# Patient Record
Sex: Female | Born: 1960 | Race: White | Hispanic: No | Marital: Married | Smoking: Former smoker
Health system: Southern US, Community
[De-identification: ages and names within clinical notes are randomized; demographics above are authoritative.]

## PROBLEM LIST (undated history)

## (undated) DIAGNOSIS — J302 Other seasonal allergic rhinitis: Secondary | ICD-10-CM

## (undated) DIAGNOSIS — J8410 Pulmonary fibrosis, unspecified: Secondary | ICD-10-CM

## (undated) DIAGNOSIS — I10 Essential (primary) hypertension: Secondary | ICD-10-CM

## (undated) HISTORY — DX: Essential (primary) hypertension: I10

## (undated) HISTORY — DX: Pulmonary fibrosis, unspecified: J84.10

---

## 1988-05-09 HISTORY — PX: LUNG BIOPSY: SHX232

## 1999-06-30 ENCOUNTER — Other Ambulatory Visit: Admission: RE | Admit: 1999-06-30 | Discharge: 1999-06-30 | Payer: Self-pay | Admitting: Obstetrics and Gynecology

## 2000-09-13 ENCOUNTER — Other Ambulatory Visit: Admission: RE | Admit: 2000-09-13 | Discharge: 2000-09-13 | Payer: Self-pay | Admitting: Obstetrics and Gynecology

## 2001-12-18 ENCOUNTER — Other Ambulatory Visit: Admission: RE | Admit: 2001-12-18 | Discharge: 2001-12-18 | Payer: Self-pay | Admitting: Obstetrics and Gynecology

## 2002-01-03 ENCOUNTER — Ambulatory Visit (HOSPITAL_COMMUNITY): Admission: RE | Admit: 2002-01-03 | Discharge: 2002-01-03 | Payer: Self-pay | Admitting: Obstetrics and Gynecology

## 2002-01-03 ENCOUNTER — Encounter: Payer: Self-pay | Admitting: Obstetrics and Gynecology

## 2003-03-14 ENCOUNTER — Ambulatory Visit (HOSPITAL_COMMUNITY): Admission: RE | Admit: 2003-03-14 | Discharge: 2003-03-14 | Payer: Self-pay | Admitting: Obstetrics and Gynecology

## 2003-03-14 ENCOUNTER — Other Ambulatory Visit: Admission: RE | Admit: 2003-03-14 | Discharge: 2003-03-14 | Payer: Self-pay | Admitting: Obstetrics and Gynecology

## 2003-04-07 ENCOUNTER — Encounter: Admission: RE | Admit: 2003-04-07 | Discharge: 2003-04-07 | Payer: Self-pay | Admitting: Obstetrics and Gynecology

## 2004-08-23 ENCOUNTER — Encounter: Admission: RE | Admit: 2004-08-23 | Discharge: 2004-08-23 | Payer: Self-pay | Admitting: Obstetrics and Gynecology

## 2004-10-06 ENCOUNTER — Other Ambulatory Visit: Admission: RE | Admit: 2004-10-06 | Discharge: 2004-10-06 | Payer: Self-pay | Admitting: Obstetrics and Gynecology

## 2005-11-30 ENCOUNTER — Encounter: Admission: RE | Admit: 2005-11-30 | Discharge: 2005-11-30 | Payer: Self-pay | Admitting: Obstetrics and Gynecology

## 2006-01-16 ENCOUNTER — Other Ambulatory Visit: Admission: RE | Admit: 2006-01-16 | Discharge: 2006-01-16 | Payer: Self-pay | Admitting: Obstetrics and Gynecology

## 2007-04-02 ENCOUNTER — Encounter: Admission: RE | Admit: 2007-04-02 | Discharge: 2007-04-02 | Payer: Self-pay | Admitting: Obstetrics and Gynecology

## 2007-05-28 ENCOUNTER — Ambulatory Visit: Payer: Self-pay | Admitting: Internal Medicine

## 2007-05-31 ENCOUNTER — Ambulatory Visit (HOSPITAL_COMMUNITY): Admission: RE | Admit: 2007-05-31 | Discharge: 2007-05-31 | Payer: Self-pay | Admitting: Internal Medicine

## 2008-04-09 ENCOUNTER — Encounter: Admission: RE | Admit: 2008-04-09 | Discharge: 2008-04-09 | Payer: Self-pay | Admitting: Obstetrics and Gynecology

## 2008-07-30 ENCOUNTER — Emergency Department (HOSPITAL_COMMUNITY): Admission: EM | Admit: 2008-07-30 | Discharge: 2008-07-30 | Payer: Self-pay | Admitting: Emergency Medicine

## 2008-12-10 ENCOUNTER — Emergency Department (HOSPITAL_COMMUNITY): Admission: EM | Admit: 2008-12-10 | Discharge: 2008-12-10 | Payer: Self-pay | Admitting: Emergency Medicine

## 2009-06-01 ENCOUNTER — Encounter: Admission: RE | Admit: 2009-06-01 | Discharge: 2009-06-01 | Payer: Self-pay | Admitting: Obstetrics and Gynecology

## 2010-05-29 ENCOUNTER — Encounter: Payer: Self-pay | Admitting: Obstetrics and Gynecology

## 2010-05-30 ENCOUNTER — Encounter: Payer: Self-pay | Admitting: Obstetrics and Gynecology

## 2010-06-07 ENCOUNTER — Encounter
Admission: RE | Admit: 2010-06-07 | Discharge: 2010-06-07 | Payer: Self-pay | Source: Home / Self Care | Attending: Obstetrics and Gynecology | Admitting: Obstetrics and Gynecology

## 2010-07-17 ENCOUNTER — Ambulatory Visit (INDEPENDENT_AMBULATORY_CARE_PROVIDER_SITE_OTHER): Payer: 59

## 2010-07-17 ENCOUNTER — Inpatient Hospital Stay (INDEPENDENT_AMBULATORY_CARE_PROVIDER_SITE_OTHER)
Admission: RE | Admit: 2010-07-17 | Discharge: 2010-07-17 | Disposition: A | Discharge status: Home / Self Care | Payer: 59 | Source: Ambulatory Visit | Attending: Family Medicine | Admitting: Family Medicine

## 2010-07-17 DIAGNOSIS — S335XXA Sprain of ligaments of lumbar spine, initial encounter: Secondary | ICD-10-CM

## 2010-08-14 LAB — POCT URINALYSIS DIP (DEVICE)
Bilirubin Urine: NEGATIVE
Glucose, UA: 100 mg/dL — AB
Ketones, ur: NEGATIVE mg/dL
Nitrite: POSITIVE — AB
Protein, ur: 30 mg/dL — AB
Specific Gravity, Urine: 1.02 (ref 1.005–1.030)
Urobilinogen, UA: 1 mg/dL (ref 0.0–1.0)
pH: 5.5 (ref 5.0–8.0)

## 2010-09-21 NOTE — Assessment & Plan Note (Signed)
[facility]                         GASTROENTEROLOGY OFFICE NOTE   NAME:Blackerby, CHARDE MACFARLANE                   MRN:          [id number]  DATE:05/28/2007                            DOB:          19-Oct-1960    Ms. Donovan is a very nice 50 year old white female who is here today  to talk about episodic abdominal pain.  So far she is at about 5 to 10  episodes over the past 2 years which start post prandially with nausea,  bloating, uncomfortable feeling in the abdomen followed by cramps and  severe diarrhea, having several bowel movements in a row.  This may  happen, usually, at the end of the day after dinner or after lunch, at  home or when she eats out.  There has been no rectal bleeding.  Her  eating habits have been regular.  She has a sedentary job for the past  10 years.  Nothing much has changed in her eating habits.  She drinks at  least 1 soda a day.  She has a lot of stress with her 40 year old son in  the last year or so.  Her weight has slowly crept up in the last 10  years by about 30 pounds.  There is no family history of colon cancer.   MEDICATIONS:  Birth control pills.   PAST HISTORY:  1. Allergies.  2. 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   OPERATIONS:  None.   FAMILY HISTORY:  Negative for colon cancer, positive for prostate cancer  in one uncle.   SOCIAL HISTORY:  Married with 2 children.  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  She does not smoke, does not drink alcohol.   REVIEW OF SYSTEMS:  Positive for night sweats, back pain, sleeping  problems, allergies, eyeglasses.   PHYSICAL EXAMINATION:  Blood pressure 120/78, pulse 80, and weight 170  pounds.  She was alert, oriented, no distress, appeared healthy.  NECK:  Supple, no lymphadenopathy.  Her thyroid was normal.  LUNGS:  Clear to auscultation.  COR:  Normal S1, normal S2.  ABDOMEN:  Soft, nontender with normoactive bowel sounds.  No distension.  Liver edge at costal margin.  Lower abdomen was normal.  RECTAL EXAM:  Normal rectal tone, stool was Hemoccult negative.   IMPRESSION:  A 50 year old white female with episodic abdominal pain and  symptoms consistent with gastrocolic reflux.  She most likely has an  irritable bowel syndrome related to stress and some dietary  indiscretions.  There is no evidence of upper gastrointestinal blood  loss.  There is no evidence of weight loss.  She has been under extreme  stress recently.  We have talked about it, and she understands what is  going on.  She is not a candidate for colonoscopy, but will be in 4  years.   PLAN:  1. High-fiber diet.  2. Benefiber 1 tbs daily.  3. Diazepam 2.5 to 5 mg p.r.n. stress.  4. Bentyl 20 mg dispense 30 one p.o. q.4-6 h. p.r.n. crampy abdominal      pain.   The Benefiber and  high-fiber diet will, hopefully, regulate her bowel  habits to where she is no longer constipated.  This may prevent the  episodes of abdominal pain and diarrhea.     Hedwig Morton. Juanda Chance, MD  Electronically Signed    DMB/MedQ  DD: 05/28/2007  DT: 05/28/2007  Job #: [id number]   cc:   Charlaine Dalton. Sherene Sires, MD, FCCP

## 2011-05-17 ENCOUNTER — Other Ambulatory Visit: Payer: Self-pay | Admitting: Obstetrics and Gynecology

## 2011-05-17 DIAGNOSIS — Z1231 Encounter for screening mammogram for malignant neoplasm of breast: Secondary | ICD-10-CM

## 2011-06-20 ENCOUNTER — Ambulatory Visit
Admission: RE | Admit: 2011-06-20 | Discharge: 2011-06-20 | Disposition: A | Discharge status: Home / Self Care | Payer: Private Health Insurance - Indemnity | Source: Ambulatory Visit | Attending: Obstetrics and Gynecology | Admitting: Obstetrics and Gynecology

## 2011-06-20 DIAGNOSIS — Z1231 Encounter for screening mammogram for malignant neoplasm of breast: Secondary | ICD-10-CM

## 2011-06-22 ENCOUNTER — Other Ambulatory Visit: Payer: Self-pay | Admitting: Obstetrics and Gynecology

## 2011-06-22 DIAGNOSIS — R928 Other abnormal and inconclusive findings on diagnostic imaging of breast: Secondary | ICD-10-CM

## 2011-07-05 ENCOUNTER — Other Ambulatory Visit: Payer: Self-pay | Admitting: Obstetrics and Gynecology

## 2011-07-05 ENCOUNTER — Ambulatory Visit
Admission: RE | Admit: 2011-07-05 | Discharge: 2011-07-05 | Disposition: A | Discharge status: Home / Self Care | Payer: Private Health Insurance - Indemnity | Source: Ambulatory Visit | Attending: Obstetrics and Gynecology | Admitting: Obstetrics and Gynecology

## 2011-07-05 DIAGNOSIS — R928 Other abnormal and inconclusive findings on diagnostic imaging of breast: Secondary | ICD-10-CM

## 2011-07-09 ENCOUNTER — Emergency Department (HOSPITAL_COMMUNITY)
Admission: EM | Admit: 2011-07-09 | Discharge: 2011-07-09 | Disposition: A | Discharge status: Home / Self Care | Payer: Private Health Insurance - Indemnity | Source: Home / Self Care | Attending: Family Medicine | Admitting: Family Medicine

## 2011-07-09 ENCOUNTER — Encounter (HOSPITAL_COMMUNITY): Payer: Self-pay

## 2011-07-09 DIAGNOSIS — N390 Urinary tract infection, site not specified: Secondary | ICD-10-CM

## 2011-07-09 LAB — POCT URINALYSIS DIP (DEVICE)
Bilirubin Urine: NEGATIVE
Glucose, UA: NEGATIVE mg/dL
Ketones, ur: NEGATIVE mg/dL
Nitrite: POSITIVE — AB
Protein, ur: 100 mg/dL — AB
Specific Gravity, Urine: 1.025 (ref 1.005–1.030)
Urobilinogen, UA: 0.2 mg/dL (ref 0.0–1.0)
pH: 5.5 (ref 5.0–8.0)

## 2011-07-09 MED ORDER — PHENAZOPYRIDINE HCL 200 MG PO TABS: 200.0000 mg | ORAL_TABLET | Freq: Three times a day (TID) | ORAL | Status: AC | PRN

## 2011-07-09 MED ORDER — CIPROFLOXACIN HCL 500 MG PO TABS: 500.0000 mg | ORAL_TABLET | Freq: Two times a day (BID) | ORAL | Status: AC

## 2011-07-09 NOTE — ED Notes (Signed)
Pt has urinary frequency, burning and is seeing blood when wiping.

## 2011-07-09 NOTE — Discharge Instructions (Signed)

## 2011-07-09 NOTE — ED Provider Notes (Signed)
History     CSN: [id number]  Arrival date & time 07/09/11  1548   First MD Initiated Contact with Patient 07/09/11 1608      Chief Complaint  Patient presents with  . Urinary Tract Infection    (Consider location/radiation/quality/duration/timing/severity/associated sxs/prior treatment) Patient is a 52 y.o. female presenting with urinary tract infection. The history is provided by the patient.  Urinary Tract Infection This is a new problem. The current episode started more than 2 days ago (started on Monday). The problem occurs constantly. The problem has been gradually worsening. Pertinent negatives include no chest pain, no abdominal pain, no headaches and no shortness of breath. The symptoms are aggravated by nothing. The symptoms are relieved by nothing. She has tried nothing for the symptoms.    History reviewed. No pertinent past medical history.  History reviewed. No pertinent past surgical history.  History reviewed. No pertinent family history.  History  Substance Use Topics  . Smoking status: Never Smoker   . Smokeless tobacco: Not on file  . Alcohol Use: No    OB History    Grav Para Term Preterm Abortions TAB SAB Ect Mult Living                  Review of Systems  Respiratory: Negative for shortness of breath.   Cardiovascular: Negative for chest pain.  Gastrointestinal: Negative for abdominal pain.  Neurological: Negative for headaches.  All other systems reviewed and are negative.    Allergies  Macrobid  Home Medications  No current outpatient prescriptions on file.  BP 127/81  Pulse 89  Temp(Src) 99.1 F (37.3 C) (Oral)  Resp 14  SpO2 99%  LMP 05/17/2011  Physical Exam  Constitutional: She is oriented to person, place, and time. She appears well-developed and well-nourished.  HENT:  Head: Normocephalic.  Abdominal: Soft. There is no tenderness. There is no rebound and no CVA tenderness.  Neurological: She is alert and oriented to  person, place, and time.  Skin: Skin is warm and dry.  Psychiatric: She has a normal mood and affect. Her behavior is normal.    ED Course  Procedures (including critical care time)  Labs Reviewed  POCT URINALYSIS DIP (DEVICE) - Abnormal; Notable for the following:    Hgb urine dipstick LARGE (*)    Protein, ur 100 (*)    Nitrite POSITIVE (*)    Leukocytes, UA MODERATE (*) Biochemical Testing Only. Please order routine urinalysis from main lab if confirmatory testing is needed.   All other components within normal limits   UTI cipro and pyridium   MDM         Hassan Rowan, MD 07/09/11 [phone]

## 2011-07-11 LAB — URINE CULTURE
Colony Count: >=100000
Culture  Setup Time: 201303022005

## 2011-07-12 NOTE — ED Notes (Signed)
Urine culture: >100,000 colonies E. Coli. Pt. adequately treated with Cipro.  Carmen Bowen 07/12/2011  

## 2012-02-01 ENCOUNTER — Ambulatory Visit (INDEPENDENT_AMBULATORY_CARE_PROVIDER_SITE_OTHER): Payer: Private Health Insurance - Indemnity | Admitting: Physician Assistant

## 2012-02-01 VITALS — BP 124/70 | HR 76 | Temp 98.6°F | Resp 16 | Ht 69.0 in | Wt 171.0 lb

## 2012-02-01 DIAGNOSIS — J3489 Other specified disorders of nose and nasal sinuses: Secondary | ICD-10-CM

## 2012-02-01 DIAGNOSIS — R0982 Postnasal drip: Secondary | ICD-10-CM

## 2012-02-01 DIAGNOSIS — H9209 Otalgia, unspecified ear: Secondary | ICD-10-CM

## 2012-02-01 DIAGNOSIS — R0981 Nasal congestion: Secondary | ICD-10-CM

## 2012-02-01 MED ORDER — IPRATROPIUM BROMIDE 0.03 % NA SOLN: 2.0000 | Freq: Two times a day (BID) | NASAL | Status: DC

## 2012-02-01 NOTE — Patient Instructions (Addendum)
Use Atrovent nasal spray twice daily.  Continue zyrtec every night.  Continue nasal saline.  Call or come in if worsening in the next two days, or if not improving in one week.

## 2012-02-01 NOTE — Progress Notes (Signed)
  Subjective:    Patient ID: Carmen Bowen, female    DOB: 09-21-1960, 51 y.o.   MRN: [id number]  HPI  51 yr old female complains of a two week history of ear itching, fullness, and pain L>R.  She is also experiencing post-nasal drip as well as nasal congestion.  She denies HA, sinus pain or pressure, or fever.  She currently takes zyrtec nightly for allergic rhinitis and uses nasal saline regularly.     Review of Systems  Constitutional: Negative for fever, chills and appetite change.  HENT: Positive for ear pain, congestion, rhinorrhea and postnasal drip. Negative for hearing loss, facial swelling, sneezing, neck stiffness, tinnitus and ear discharge.   Eyes: Negative.   Respiratory: Negative.   Cardiovascular: Negative.   Gastrointestinal: Negative.   Neurological: Negative for headaches.       Objective:   Physical Exam  Constitutional: She appears well-developed and well-nourished. No distress.  HENT:  Right Ear: Hearing, external ear and ear canal normal. Tympanic membrane is not erythematous, not retracted and not bulging. A middle ear effusion is present.  Left Ear: Hearing, external ear and ear canal normal. Tympanic membrane is not erythematous, not retracted and not bulging. A middle ear effusion is present.  Nose: Mucosal edema and rhinorrhea present.  Mouth/Throat: Oropharynx is clear and moist and mucous membranes are normal.  Cardiovascular: Normal rate, regular rhythm and normal heart sounds.   Pulmonary/Chest: Breath sounds normal. She has no wheezes. She has no rales.  Lymphadenopathy:    She has no cervical adenopathy.          Assessment & Plan:   1. Nasal congestion  ipratropium (ATROVENT) 0.03 % nasal spray  2. Ear pain    3. Post-nasal drip     Given the patient's allergic symptoms, and lack of erythema/bulging of the TMs, I do not think an antibiotic is appropriate at this time.  Discussed with patient, who is in agreement.  Sent prescription for  Atrovent nasal spray to relieve nasal drainage symptoms.  Offered a short steroid taper to reduce Eustachian tube dysfunction, but patient was not interested in steroids at this time.  Encouraged continuation of daily zyrtec as well as nasal saline.  Instructed patient to call or come in if feeling worse over the next two days or not improving in one week.    I have discussed, reviewed chart, and agree with plan.  Rhoderick Moody, PA-C

## 2012-05-07 ENCOUNTER — Encounter (HOSPITAL_COMMUNITY): Payer: Self-pay | Admitting: Pharmacist

## 2012-05-14 ENCOUNTER — Encounter (HOSPITAL_COMMUNITY)
Admission: RE | Admit: 2012-05-14 | Discharge: 2012-05-14 | Disposition: A | Discharge status: Home / Self Care | Payer: Managed Care, Other (non HMO) | Source: Ambulatory Visit | Attending: Obstetrics and Gynecology | Admitting: Obstetrics and Gynecology

## 2012-05-14 ENCOUNTER — Encounter (HOSPITAL_COMMUNITY): Payer: Self-pay

## 2012-05-14 HISTORY — DX: Other seasonal allergic rhinitis: J30.2

## 2012-05-14 LAB — CBC
MCH: 31 pg (ref 26.0–34.0)
MCHC: 33.7 g/dL (ref 30.0–36.0)
RDW: 12.9 % (ref 11.5–15.5)

## 2012-05-14 NOTE — Patient Instructions (Addendum)
   Your procedure is scheduled AV:[id number] January 7th  Enter through the Main Entrance of [facility] at:6am Pick up the phone at the desk and dial [phone] and inform us of your arrival.  Please call this number if you have any problems the morning of surgery: [phone]  Remember: Do not eat or drink anything after midnight on Monday   Do not wear jewelry, make-up, or FINGER nail polish No metal in your hair or on your body. Do not wear lotions, powders, perfumes. You may wear deodorant.  Please use your CHG wash as directed prior to surgery.  Do not shave anywhere for at least 12 hours prior to first CHG shower.  Do not bring valuables to the hospital.   Leave suitcase in the car. After Surgery it may be brought to your room. For patients being admitted to the hospital, checkout time is 11:00am the day of discharge.

## 2012-05-14 NOTE — H&P (Signed)
Carmen Bowen is an 52 y.o. female G2P2 who presents for a scheduled LAVH for pelvic prolapse.  She first noted symptoms in the last year that have become more troublesome in the last 6 months.  She denies urinary incontinence or symptoms.  SHe does not want her ovaries removed, but is agreeable to removing her tubes to possibly decrease her risk of future ovarian cancer.   She is postmenopausal.  Pertinent Gynecological History: prolapse Menstrual History:  Patient's last menstrual period was 05/17/2011.    Past Medical History  Diagnosis Date  . Pulmonary fibrosis dxx 20 years ago    Sees Dr. Sandrea Hughs (pulm)  . Seasonal allergies     Past Surgical History  Procedure Date  . Lung biopsy 1990    No family history on file.  Social History:  reports that she quit smoking about 34 years ago. She does not have any smokeless tobacco history on file. She reports that she does not drink alcohol or use illicit drugs.  Allergies:  Allergies  Allergen Reactions  . Nitrofurantoin Monohyd Macro Palpitations and Other (See Comments)    Flu-like symptoms    No prescriptions prior to admission    ROS  Last menstrual period 05/17/2011. Physical Exam  Constitutional: She is oriented to person, place, and time. She appears well-developed and well-nourished.  Cardiovascular: Normal rate and regular rhythm.   Respiratory: Effort normal and breath sounds normal.  GI: Soft. Bowel sounds are normal.  Genitourinary: Vagina normal.       Uterine prolapse with cervix approaching introitus Normal size uterine fundus No adnexal masses Minimal cystocele Moderate rectocele  Musculoskeletal: Normal range of motion.  Neurological: She is alert and oriented to person, place, and time.  Psychiatric: She has a normal mood and affect. Her behavior is normal.    Results for orders placed during the hospital encounter of 05/14/12 (from the past 24 hour(s))  CBC     Status: Normal   Collection Time   05/14/12  9:15 AM      Component Value Range   WBC 6.5  4.0 - 10.5 K/uL   RBC 4.52  3.87 - 5.11 MIL/uL   Hemoglobin 14.0  12.0 - 15.0 g/dL   HCT 96.0  45.4 - 09.8 %   MCV 92.0  78.0 - 100.0 fL   MCH 31.0  26.0 - 34.0 pg   MCHC 33.7  30.0 - 36.0 g/dL   RDW 11.9  14.7 - 82.9 %   Platelets 242  150 - 400 K/uL    No results found.  Assessment/Plan: The patient was counseled on risks and benefits of surgery including bleeding, infection and possible damage to bowel and bladder.  We discussed the need for an open incision should a complication arise and possible delayed recovery.  We also discussed at length whether to remove or keep her ovaries.  She would like to retain her ovaries, but is interested in a distal salpingectomy to possibly reduce her future ovarian cancer risk.  She is ready to proceed.  Oliver Pila 05/14/2012, 5:07 PM

## 2012-05-15 ENCOUNTER — Encounter (HOSPITAL_COMMUNITY): Payer: Self-pay | Admitting: *Deleted

## 2012-05-15 ENCOUNTER — Observation Stay (HOSPITAL_COMMUNITY)
Admission: RE | Admit: 2012-05-15 | Discharge: 2012-05-16 | Disposition: A | Discharge status: Home / Self Care | Payer: Managed Care, Other (non HMO) | Source: Ambulatory Visit | Attending: Obstetrics and Gynecology | Admitting: Obstetrics and Gynecology

## 2012-05-15 ENCOUNTER — Ambulatory Visit (HOSPITAL_COMMUNITY): Payer: Managed Care, Other (non HMO) | Admitting: Anesthesiology

## 2012-05-15 ENCOUNTER — Encounter (HOSPITAL_COMMUNITY): Payer: Self-pay | Admitting: Anesthesiology

## 2012-05-15 ENCOUNTER — Encounter (HOSPITAL_COMMUNITY)
Admission: RE | Disposition: A | Discharge status: Home / Self Care | Payer: Self-pay | Source: Ambulatory Visit | Attending: Obstetrics and Gynecology

## 2012-05-15 ENCOUNTER — Encounter (HOSPITAL_COMMUNITY): Payer: Self-pay

## 2012-05-15 DIAGNOSIS — Z90710 Acquired absence of both cervix and uterus: Secondary | ICD-10-CM

## 2012-05-15 DIAGNOSIS — N8000 Endometriosis of the uterus, unspecified: Secondary | ICD-10-CM | POA: Insufficient documentation

## 2012-05-15 DIAGNOSIS — Z01812 Encounter for preprocedural laboratory examination: Secondary | ICD-10-CM | POA: Insufficient documentation

## 2012-05-15 DIAGNOSIS — Z01818 Encounter for other preprocedural examination: Secondary | ICD-10-CM | POA: Insufficient documentation

## 2012-05-15 DIAGNOSIS — N812 Incomplete uterovaginal prolapse: Principal | ICD-10-CM | POA: Insufficient documentation

## 2012-05-15 DIAGNOSIS — R0981 Nasal congestion: Secondary | ICD-10-CM

## 2012-05-15 DIAGNOSIS — D250 Submucous leiomyoma of uterus: Secondary | ICD-10-CM | POA: Insufficient documentation

## 2012-05-15 DIAGNOSIS — D251 Intramural leiomyoma of uterus: Secondary | ICD-10-CM | POA: Insufficient documentation

## 2012-05-15 DIAGNOSIS — N838 Other noninflammatory disorders of ovary, fallopian tube and broad ligament: Secondary | ICD-10-CM | POA: Insufficient documentation

## 2012-05-15 HISTORY — PX: ANTERIOR AND POSTERIOR REPAIR: SHX5121

## 2012-05-15 HISTORY — PX: BILATERAL SALPINGECTOMY: SHX5743

## 2012-05-15 HISTORY — PX: LAPAROSCOPIC ASSISTED VAGINAL HYSTERECTOMY: SHX5398

## 2012-05-15 LAB — TYPE AND SCREEN: Antibody Screen: NEGATIVE

## 2012-05-15 LAB — SURGICAL PCR SCREEN: MRSA, PCR: NEGATIVE

## 2012-05-15 SURGERY — HYSTERECTOMY, VAGINAL, LAPAROSCOPY-ASSISTED
Anesthesia: General | Site: Vagina | Wound class: Clean Contaminated

## 2012-05-15 MED ORDER — NEOSTIGMINE METHYLSULFATE 1 MG/ML IJ SOLN
INTRAMUSCULAR | Status: DC | PRN
  Administered 2012-05-15: 4 mg via INTRAVENOUS

## 2012-05-15 MED ORDER — DIPHENHYDRAMINE HCL 50 MG/ML IJ SOLN: 12.5000 mg | Freq: Four times a day (QID) | INTRAMUSCULAR | Status: DC | PRN

## 2012-05-15 MED ORDER — EPHEDRINE SULFATE 50 MG/ML IJ SOLN
INTRAMUSCULAR | Status: DC | PRN
  Administered 2012-05-15: 5 mg via INTRAVENOUS

## 2012-05-15 MED ORDER — MUPIROCIN 2 % EX OINT
TOPICAL_OINTMENT | Freq: Two times a day (BID) | CUTANEOUS | Status: DC
  Administered 2012-05-15: 1 via NASAL

## 2012-05-15 MED ORDER — EPHEDRINE 5 MG/ML INJ
INTRAVENOUS | Status: AC
  Filled 2012-05-15: qty 10

## 2012-05-15 MED ORDER — KETOROLAC TROMETHAMINE 30 MG/ML IJ SOLN
INTRAMUSCULAR | Status: AC
  Administered 2012-05-15: 30 mg via INTRAVENOUS
  Filled 2012-05-15: qty 1

## 2012-05-15 MED ORDER — PROMETHAZINE HCL 25 MG/ML IJ SOLN: 6.2500 mg | INTRAMUSCULAR | Status: DC | PRN

## 2012-05-15 MED ORDER — LORATADINE 10 MG PO TABS
10.0000 mg | ORAL_TABLET | Freq: Every day | ORAL | Status: DC
  Filled 2012-05-15 (×3): qty 1

## 2012-05-15 MED ORDER — CEFAZOLIN SODIUM-DEXTROSE 2-3 GM-% IV SOLR: 2.0000 g | INTRAVENOUS | Status: DC

## 2012-05-15 MED ORDER — SENNOSIDES-DOCUSATE SODIUM 8.6-50 MG PO TABS: 1.0000 | ORAL_TABLET | Freq: Every evening | ORAL | Status: DC | PRN

## 2012-05-15 MED ORDER — ESTRADIOL 0.1 MG/GM VA CREA
TOPICAL_CREAM | VAGINAL | Status: DC | PRN
  Administered 2012-05-15: 1 via VAGINAL

## 2012-05-15 MED ORDER — LACTATED RINGERS IV SOLN
INTRAVENOUS | Status: DC
  Administered 2012-05-15: 11:00:00 via INTRAVENOUS

## 2012-05-15 MED ORDER — DIPHENHYDRAMINE HCL 12.5 MG/5ML PO ELIX: 12.5000 mg | ORAL_SOLUTION | Freq: Four times a day (QID) | ORAL | Status: DC | PRN

## 2012-05-15 MED ORDER — LACTATED RINGERS IV SOLN
INTRAVENOUS | Status: DC
  Administered 2012-05-15 – 2012-05-16 (×2): via INTRAVENOUS

## 2012-05-15 MED ORDER — MENTHOL 3 MG MT LOZG: 1.0000 | LOZENGE | OROMUCOSAL | Status: DC | PRN

## 2012-05-15 MED ORDER — VASOPRESSIN 20 UNIT/ML IJ SOLN
INTRAMUSCULAR | Status: DC | PRN
  Administered 2012-05-15: 08:00:00 via INTRAMUSCULAR

## 2012-05-15 MED ORDER — PROPOFOL 10 MG/ML IV EMUL
INTRAVENOUS | Status: DC | PRN
  Administered 2012-05-15: 200 mg via INTRAVENOUS

## 2012-05-15 MED ORDER — DEXAMETHASONE SODIUM PHOSPHATE 10 MG/ML IJ SOLN
INTRAMUSCULAR | Status: AC
  Filled 2012-05-15: qty 1

## 2012-05-15 MED ORDER — ONDANSETRON HCL 4 MG/2ML IJ SOLN
INTRAMUSCULAR | Status: DC | PRN
  Administered 2012-05-15: 4 mg via INTRAVENOUS

## 2012-05-15 MED ORDER — ESTRADIOL 0.1 MG/GM VA CREA
TOPICAL_CREAM | VAGINAL | Status: AC
  Filled 2012-05-15: qty 42.5

## 2012-05-15 MED ORDER — CEFAZOLIN SODIUM-DEXTROSE 2-3 GM-% IV SOLR
INTRAVENOUS | Status: AC
  Administered 2012-05-15: 2 g via INTRAVENOUS
  Filled 2012-05-15: qty 50

## 2012-05-15 MED ORDER — HYDROMORPHONE HCL PF 1 MG/ML IJ SOLN
0.2500 mg | INTRAMUSCULAR | Status: DC | PRN
  Administered 2012-05-15 (×2): 0.5 mg via INTRAVENOUS

## 2012-05-15 MED ORDER — GLYCOPYRROLATE 0.2 MG/ML IJ SOLN
INTRAMUSCULAR | Status: AC
  Filled 2012-05-15: qty 1

## 2012-05-15 MED ORDER — MEPERIDINE HCL 25 MG/ML IJ SOLN: 6.2500 mg | INTRAMUSCULAR | Status: DC | PRN

## 2012-05-15 MED ORDER — PROPOFOL 10 MG/ML IV EMUL
INTRAVENOUS | Status: AC
  Filled 2012-05-15: qty 20

## 2012-05-15 MED ORDER — ONDANSETRON HCL 4 MG/2ML IJ SOLN: 4.0000 mg | Freq: Four times a day (QID) | INTRAMUSCULAR | Status: DC | PRN

## 2012-05-15 MED ORDER — FENTANYL CITRATE 0.05 MG/ML IJ SOLN
INTRAMUSCULAR | Status: DC | PRN
  Administered 2012-05-15: 100 ug via INTRAVENOUS
  Administered 2012-05-15 (×2): 50 ug via INTRAVENOUS

## 2012-05-15 MED ORDER — SODIUM CHLORIDE 0.9 % IJ SOLN: 9.0000 mL | INTRAMUSCULAR | Status: DC | PRN

## 2012-05-15 MED ORDER — LIDOCAINE HCL (CARDIAC) 20 MG/ML IV SOLN
INTRAVENOUS | Status: AC
  Filled 2012-05-15: qty 5

## 2012-05-15 MED ORDER — BUPIVACAINE HCL (PF) 0.25 % IJ SOLN
INTRAMUSCULAR | Status: AC
  Filled 2012-05-15: qty 30

## 2012-05-15 MED ORDER — SODIUM CHLORIDE 0.9 % IJ SOLN
INTRAMUSCULAR | Status: DC | PRN
  Administered 2012-05-15: 7 mL

## 2012-05-15 MED ORDER — FENTANYL CITRATE 0.05 MG/ML IJ SOLN
INTRAMUSCULAR | Status: AC
  Filled 2012-05-15: qty 5

## 2012-05-15 MED ORDER — LACTATED RINGERS IV SOLN
INTRAVENOUS | Status: DC | PRN
  Administered 2012-05-15 (×3): via INTRAVENOUS

## 2012-05-15 MED ORDER — VASOPRESSIN 20 UNIT/ML IJ SOLN
INTRAMUSCULAR | Status: AC
  Filled 2012-05-15: qty 1

## 2012-05-15 MED ORDER — DOCUSATE SODIUM 100 MG PO CAPS
100.0000 mg | ORAL_CAPSULE | Freq: Two times a day (BID) | ORAL | Status: DC
  Administered 2012-05-15 – 2012-05-16 (×2): 100 mg via ORAL
  Filled 2012-05-15 (×2): qty 1

## 2012-05-15 MED ORDER — MUPIROCIN 2 % EX OINT
TOPICAL_OINTMENT | CUTANEOUS | Status: AC
  Administered 2012-05-15: 1 via NASAL
  Filled 2012-05-15: qty 22

## 2012-05-15 MED ORDER — GLYCOPYRROLATE 0.2 MG/ML IJ SOLN
INTRAMUSCULAR | Status: DC | PRN
  Administered 2012-05-15: 0.6 mg via INTRAVENOUS

## 2012-05-15 MED ORDER — LACTATED RINGERS IV SOLN
INTRAVENOUS | Status: DC
  Administered 2012-05-15: 07:00:00 via INTRAVENOUS

## 2012-05-15 MED ORDER — NALOXONE HCL 0.4 MG/ML IJ SOLN: 0.4000 mg | INTRAMUSCULAR | Status: DC | PRN

## 2012-05-15 MED ORDER — NEOSTIGMINE METHYLSULFATE 1 MG/ML IJ SOLN
INTRAMUSCULAR | Status: AC
  Filled 2012-05-15: qty 1

## 2012-05-15 MED ORDER — MIDAZOLAM HCL 2 MG/2ML IJ SOLN
INTRAMUSCULAR | Status: AC
  Filled 2012-05-15: qty 4

## 2012-05-15 MED ORDER — ROCURONIUM BROMIDE 50 MG/5ML IV SOLN
INTRAVENOUS | Status: AC
  Filled 2012-05-15: qty 1

## 2012-05-15 MED ORDER — LIDOCAINE HCL (CARDIAC) 20 MG/ML IV SOLN
INTRAVENOUS | Status: DC | PRN
  Administered 2012-05-15: 80 mg via INTRAVENOUS

## 2012-05-15 MED ORDER — DEXAMETHASONE SODIUM PHOSPHATE 4 MG/ML IJ SOLN
INTRAMUSCULAR | Status: DC | PRN
  Administered 2012-05-15: 10 mg via INTRAVENOUS

## 2012-05-15 MED ORDER — SIMETHICONE 80 MG PO CHEW: 80.0000 mg | CHEWABLE_TABLET | Freq: Four times a day (QID) | ORAL | Status: DC | PRN

## 2012-05-15 MED ORDER — IPRATROPIUM BROMIDE 0.03 % NA SOLN: 2.0000 | Freq: Two times a day (BID) | NASAL | Status: DC

## 2012-05-15 MED ORDER — BUPIVACAINE HCL (PF) 0.25 % IJ SOLN
INTRAMUSCULAR | Status: DC | PRN
  Administered 2012-05-15: 12 mL

## 2012-05-15 MED ORDER — HYDROMORPHONE 0.3 MG/ML IV SOLN
INTRAVENOUS | Status: DC
  Administered 2012-05-15: 0.999 mg via INTRAVENOUS
  Administered 2012-05-15: 11:00:00 via INTRAVENOUS
  Administered 2012-05-15: 2.69 mg via INTRAVENOUS
  Administered 2012-05-15: 7.9 mg via INTRAVENOUS
  Administered 2012-05-16: 0.799 mg via INTRAVENOUS
  Administered 2012-05-16: 0.399 mg via INTRAVENOUS
  Filled 2012-05-15: qty 25

## 2012-05-15 MED ORDER — ROCURONIUM BROMIDE 100 MG/10ML IV SOLN
INTRAVENOUS | Status: DC | PRN
  Administered 2012-05-15: 50 mg via INTRAVENOUS
  Administered 2012-05-15: 10 mg via INTRAVENOUS

## 2012-05-15 MED ORDER — HYDROMORPHONE HCL PF 1 MG/ML IJ SOLN
INTRAMUSCULAR | Status: AC
  Administered 2012-05-15: 0.5 mg via INTRAVENOUS
  Filled 2012-05-15: qty 1

## 2012-05-15 MED ORDER — KETOROLAC TROMETHAMINE 30 MG/ML IJ SOLN
15.0000 mg | Freq: Once | INTRAMUSCULAR | Status: AC | PRN
  Administered 2012-05-15: 30 mg via INTRAVENOUS

## 2012-05-15 MED ORDER — OXYCODONE-ACETAMINOPHEN 5-325 MG PO TABS: 1.0000 | ORAL_TABLET | ORAL | Status: DC | PRN

## 2012-05-15 MED ORDER — MIDAZOLAM HCL 5 MG/5ML IJ SOLN
INTRAMUSCULAR | Status: DC | PRN
  Administered 2012-05-15: 2 mg via INTRAVENOUS

## 2012-05-15 MED ORDER — IBUPROFEN 600 MG PO TABS
600.0000 mg | ORAL_TABLET | Freq: Four times a day (QID) | ORAL | Status: DC | PRN
  Administered 2012-05-16: 600 mg via ORAL
  Filled 2012-05-15: qty 1

## 2012-05-15 MED ORDER — ONDANSETRON HCL 4 MG PO TABS: 4.0000 mg | ORAL_TABLET | Freq: Four times a day (QID) | ORAL | Status: DC | PRN

## 2012-05-15 MED ORDER — ONDANSETRON HCL 4 MG/2ML IJ SOLN
INTRAMUSCULAR | Status: AC
  Filled 2012-05-15: qty 2

## 2012-05-15 SURGICAL SUPPLY — 63 items
ADH SKN CLS APL DERMABOND .7 (GAUZE/BANDAGES/DRESSINGS) ×4
BLADE SURG 15 STRL LF C SS BP (BLADE) IMPLANT
BLADE SURG 15 STRL SS (BLADE)
CABLE HIGH FREQUENCY MONO STRZ (ELECTRODE) IMPLANT
CATH ROBINSON RED A/P 16FR (CATHETERS) IMPLANT
CHLORAPREP W/TINT 26ML (MISCELLANEOUS) ×5 IMPLANT
CLOTH BEACON ORANGE TIMEOUT ST (SAFETY) ×5 IMPLANT
CONT PATH 16OZ SNAP LID 3702 (MISCELLANEOUS) ×5 IMPLANT
COVER TABLE BACK 60X90 (DRAPES) ×5 IMPLANT
DECANTER SPIKE VIAL GLASS SM (MISCELLANEOUS) ×10 IMPLANT
DERMABOND ADVANCED (GAUZE/BANDAGES/DRESSINGS) ×1
DERMABOND ADVANCED .7 DNX12 (GAUZE/BANDAGES/DRESSINGS) ×1 IMPLANT
ELECT REM PT RETURN 9FT ADLT (ELECTROSURGICAL) ×5
ELECTRODE REM PT RTRN 9FT ADLT (ELECTROSURGICAL) ×1 IMPLANT
GAUZE PACKING 2X5 YD STERILE (GAUZE/BANDAGES/DRESSINGS) ×5 IMPLANT
GLOVE BIO SURGEON STRL SZ 6.5 (GLOVE) ×2 IMPLANT
GLOVE BIO SURGEON STRL SZ8 (GLOVE) ×7 IMPLANT
GLOVE BIO SURGEON STRL SZ8.5 (GLOVE) ×2 IMPLANT
GLOVE BIOGEL PI IND STRL 6.5 (GLOVE) ×5 IMPLANT
GLOVE BIOGEL PI IND STRL 7.0 (GLOVE) ×4 IMPLANT
GLOVE BIOGEL PI IND STRL 8.5 (GLOVE) ×1 IMPLANT
GLOVE BIOGEL PI INDICATOR 6.5 (GLOVE) ×2
GLOVE BIOGEL PI INDICATOR 7.0 (GLOVE) ×4
GLOVE BIOGEL PI INDICATOR 8.5 (GLOVE) ×1
GLOVE ORTHO TXT STRL SZ7.5 (GLOVE) ×6 IMPLANT
GLOVE SURG SS PI 7.0 STRL IVOR (GLOVE) ×2 IMPLANT
GOWN PREVENTION PLUS XLARGE (GOWN DISPOSABLE) ×3 IMPLANT
GOWN STRL REIN XL XLG (GOWN DISPOSABLE) ×24 IMPLANT
MARKER SKIN DUAL TIP RULER LAB (MISCELLANEOUS) ×2 IMPLANT
NDL INSUFFLATION 14GA 120MM (NEEDLE) ×3 IMPLANT
NEEDLE HYPO 22GX1.5 SAFETY (NEEDLE) ×2 IMPLANT
NEEDLE INSUFFLATION 14GA 120MM (NEEDLE) ×5 IMPLANT
NEEDLE MAYO .5 CIRCLE (NEEDLE) IMPLANT
NS IRRIG 1000ML POUR BTL (IV SOLUTION) ×7 IMPLANT
PACK LAVH (CUSTOM PROCEDURE TRAY) ×5 IMPLANT
PACK VAGINAL WOMENS (CUSTOM PROCEDURE TRAY) ×7 IMPLANT
PAD OB MATERNITY 4.3X12.25 (PERSONAL CARE ITEMS) ×2 IMPLANT
PROTECTOR NERVE ULNAR (MISCELLANEOUS) ×7 IMPLANT
SCALPEL HARMONIC ACE (MISCELLANEOUS) ×5 IMPLANT
SET IRRIG TUBING LAPAROSCOPIC (IRRIGATION / IRRIGATOR) ×5 IMPLANT
SLEEVE Z-THREAD 5X100MM (TROCAR) ×10 IMPLANT
STRIP CLOSURE SKIN 1/4X3 (GAUZE/BANDAGES/DRESSINGS) IMPLANT
SUT PROLENE 1 CT 1 30 (SUTURE) IMPLANT
SUT SILK 0 FSL (SUTURE) ×5 IMPLANT
SUT VIC AB 0 CT1 18XCR BRD8 (SUTURE) ×11 IMPLANT
SUT VIC AB 0 CT1 27 (SUTURE) ×5
SUT VIC AB 0 CT1 27XBRD ANBCTR (SUTURE) ×1 IMPLANT
SUT VIC AB 0 CT1 8-18 (SUTURE) ×10
SUT VIC AB 2-0 CT1 (SUTURE) ×5 IMPLANT
SUT VIC AB 2-0 CT1 27 (SUTURE) ×10
SUT VIC AB 2-0 CT1 TAPERPNT 27 (SUTURE) ×2 IMPLANT
SUT VIC AB 2-0 UR5 27 (SUTURE) IMPLANT
SUT VICRYL 0 TIES 12 18 (SUTURE) ×2 IMPLANT
SUT VICRYL 0 UR6 27IN ABS (SUTURE) ×2 IMPLANT
SUT VICRYL 4-0 PS2 18IN ABS (SUTURE) ×5 IMPLANT
SYR TB 1ML 25GX5/8 (SYRINGE) ×2 IMPLANT
TOWEL OR 17X24 6PK STRL BLUE (TOWEL DISPOSABLE) ×12 IMPLANT
TRAY FOLEY CATH 14FR (SET/KITS/TRAYS/PACK) ×5 IMPLANT
TROCAR Z-THREAD BLADED 11X100M (TROCAR) ×2 IMPLANT
TROCAR Z-THREAD FIOS 5X100MM (TROCAR) ×5 IMPLANT
TROCAR Z-THREAD OPTICAL 11X100 (TROCAR) ×2 IMPLANT
WARMER LAPAROSCOPE (MISCELLANEOUS) ×5 IMPLANT
WATER STERILE IRR 1000ML POUR (IV SOLUTION) ×5 IMPLANT

## 2012-05-15 NOTE — Brief Op Note (Signed)
05/15/2012  9:21 AM  PATIENT:  Carmen Bowen  52 y.o. female  PRE-OPERATIVE DIAGNOSIS:  uterine prolapse  POST-OPERATIVE DIAGNOSIS:  uterine prolapse  PROCEDURE:  LAVH/Salpingectomy and Posterior repair  SURGEON:  Surgeon(s) and Role:    * Oliver Pila, MD - Primary    * Lavina Hamman, MD - Assisting  ANESTHESIA:   general  EBL:  Total I/O In: 1600 [I.V.:1600] Out: 550 [Urine:350; Blood:200]  BLOOD ADMINISTERED:none  DRAINS: Urinary Catheter (Foley)   LOCAL MEDICATIONS USED:  MARCAINE     SPECIMEN:  Uterus, tubes,  paratubal cyst and cervix  DISPOSITION OF SPECIMEN:  PATHOLOGY  COUNTS:  YES  TOURNIQUET:  * No tourniquets in log *  DICTATION: .Dragon Dictation  PLAN OF CARE: Admit for overnight observation  PATIENT DISPOSITION:  PACU - hemodynamically stable.

## 2012-05-15 NOTE — Progress Notes (Signed)
Patient ID: Carmen Bowen, female   DOB: Feb 24, 1961, 52 y.o.   MRN: [id number] Per pt no changes in dictated H&P.  Brief exam WNL.

## 2012-05-15 NOTE — Op Note (Signed)
Operative note  Preoperative diagnosis Uterine prolapse Rectocele  Postoperative diagnosis Same with paratubal cyst on the right tube  Procedure Laparoscopic-assisted vaginal hysterectomy and bilateral salpingectomy Posterior repair  Surgeon Dr. Huel Cote Dr. Tawanna Cooler Meisinger  Anesthesia Gen.  Fluids Estimated blood loss 200 cc Urine output 350 cc clear urine IV fluids 1600 cc LR  Findings Uterus had prolapsed down to the level of the introitus and there was a moderate rectocele. The ovaries were normal bilaterally. The fallopian tube on the right had approximately 2 cm paratubal cyst. The uterus appeared normal.  Specimen Uterus, tubes, paratubal cyst and cervix  Procedure note After informed consent was obtained from the patient she was taken to the operating room where general anesthesia was obtained without difficulty. She was prepped and draped in the normal sterile fashion in the dorsal lithotomy position and an appropriate time out performed. A speculum was placed within the vagina and a Hulka tenaculum placed within the uterus for uterine manipulation. A Foley catheter was also placed. Attention was then turned to the abdomen where the infraumbilical area was injected with quarter percent Marcaine and a 1-1/2 cm incision made with the scalpel. The varies needle was then easily introduced and intraperitoneal placement confirmed with aspiration injection with normal saline. Gas flow was applied and a normal pressure of 4 noted with pneumoperitoneum obtained with approximately 3 and half liters of CO2 gas. The varies needle was then removed and a 1011 Optiview trocar utilized to enter the abdominal cavity. The pelvic structures and abdomen were inspected with findings as previously stated. 2 additional 5 mm ports were placed in the lateral quadrants with direct visualization after injection with quarter percent Marcaine. With these trochars in place the harmonic scalpel was  utilized to dissect the fallopian tube away from the ovary and come down the broad ligament and round ligament and begin the bladder flap. This was performed bilaterally. A 2 cm paratubal cyst was noted on the right and was removed with the tube. Both ovaries appeared normal. Once the fallopian tubes and uterus were dissected away from the sidewalls and the bladder flap opened the pedicles were inspected with no active bleeding noted. Instruments and sponges were then removed from the abdomen and attention was turned vaginally. The cervix was injected with a dilute solution of Pitressin and a circumferential incision made around the cervix itself. With [employer] and placed x2 on the cervix it was retracted downward and the overlying vaginal mucosa dissected off the cervix. The anterior cul-de-sac was entered sharply and the Deaver retractor placed within it similarly the posterior cul-de-sac was entered sharply and the banana speculum was placed within it thus with the rectum and bladder isolated the uterosacral ligaments were taken down bilaterally clamped with a Zeppelin clamp transected and suture ligated with 0 Vicryl. These were held with hemostats. The remainder of the pierced ears cervical tissue and the uterine arteries were then sequentially clamped with a Zeppelin clamp transected and suture ligated with 0 Vicryl. When this was down to a minimum the uterus was delivered from the vagina and the remaining pedicles a very small and clamped and transected with the uterus completely removed and handed off to pathology with the fallopian tubes attached. The final pedicles were secured with free ties and suture ligature of 0 Vicryl. 2 additional figure-of-eight sutures of 0 Vicryl were placed along the left pedicle for good hemostasis. At this point all appeared dry and the peritoneum was closed with 2-0 Vicryl in  a running pursestring fashion. The banana speculum was replaced with a weighted speculum  at this step. Uterosacral ligaments were then reapproximated and supported the vaginal cuff with a 0 Vicryl suture. The previously held tags were also tied to one another for increased support. The vaginal cuff was then closed with 2-0 Vicryl in a running locked fashion. All instruments and sponge counts were correct at this juncture and attention was turned to begin the posterior repair The vaginal introitus was grasped with Allis clamps after a rectal exam confirmed a distal rectocele. The vaginal mucosa was then dissected down the midline with Metzenbaum scissors and reflected laterally with Allis clamps. The rectovaginal fascia was dissected off the mucosal flaps and pushed medially. Once this was accomplished 0 Vicryl suture was utilized to reapproximate the rectovaginal fascia over the rectocele defect which was distal. When this was reapproximated with several interrupted sutures of 0 Vicryl the excess mucosa was trimmed away and closed with 2-0 Vicryl in a running locked fashion. Again all instrument sponge counts were correct there was no active bleeding noted in the vagina was packed with an Estrace coated gauze. Gloves were changed and the pneumoperitoneum reobtained abdominally. The camera was introduced into the trocar and the vaginal cuff and pedicles all inspected and found to be hemostatic. Ureters were visualized bilaterally and were peristalsing normally well away from the incision sites and normal in caliber. Therefore the 5 mm trochars were removed under direct visualization and the pneumoperitoneum reduced through the umbilical trocar. This was then removed and all trocar sites closed. The umbilical port was closed with one deep figure-of-eight suture of 0 Vicryl and 3-0 subcuticular stitch. The 5 mm ports were closed with a subcuticular stitch of 3-0 Vicryl and Dermabond was placed over all port sites. The patient was then awakened and taken to the recovery room in good condition. An

## 2012-05-15 NOTE — Anesthesia Preprocedure Evaluation (Signed)
Anesthesia Evaluation  Patient identified by MRN, date of birth, ID band Patient awake    Reviewed: Allergy & Precautions, H&P , NPO status , Patient's Chart, lab work & pertinent test results  Airway Mallampati: I TM Distance: >3 FB Neck ROM: full    Dental No notable dental hx. 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 Pt is experiencing no problems related to the Dx breath sounds clear to auscultation  Pulmonary exam normal       Cardiovascular negative cardio ROS      Neuro/Psych negative neurological ROS  negative psych ROS   GI/Hepatic negative GI ROS, Neg liver ROS,   Endo/Other  negative endocrine ROS  Renal/GU negative Renal ROS  negative genitourinary   Musculoskeletal negative musculoskeletal ROS (+)   Abdominal Normal abdominal exam  (+)   Peds negative pediatric ROS (+)  Hematology negative hematology ROS (+)   Anesthesia Other Findings   Reproductive/Obstetrics negative OB ROS                           Anesthesia Physical Anesthesia Plan  ASA: II  Anesthesia Plan: General   Post-op Pain Management:    Induction: Intravenous  Airway Management Planned: Oral ETT  Additional Equipment:   Intra-op Plan:   Post-operative Plan: Extubation in OR  Informed Consent: I have reviewed the patients History and Physical, chart, labs and discussed the procedure including the risks, benefits and alternatives for the proposed anesthesia with the patient or authorized representative who has indicated his/her understanding and acceptance.   Dental Advisory Given  Plan Discussed with: CRNA and Surgeon  Anesthesia Plan Comments:         Anesthesia Quick Evaluation

## 2012-05-15 NOTE — Anesthesia Procedure Notes (Signed)
Procedure Name: Intubation Date/Time: 05/15/2012 7:33 AM Performed by: Danicka Hourihan, Jannet Askew Pre-anesthesia Checklist: Patient identified, Patient being monitored, Emergency Drugs available, Timeout performed and Suction available Patient Re-evaluated:Patient Re-evaluated prior to inductionOxygen Delivery Method: Circle system utilized Preoxygenation: Pre-oxygenation with 100% oxygen Intubation Type: IV induction Ventilation: Mask ventilation without difficulty Laryngoscope Size: Mac and 4 Grade View: Grade I Tube size: 7.0 mm Number of attempts: 1 Placement Confirmation: ETT inserted through vocal cords under direct vision and positive ETCO2 Secured at: 22 cm Dental Injury: Teeth and Oropharynx as per pre-operative assessment

## 2012-05-15 NOTE — Transfer of Care (Signed)
Immediate Anesthesia Transfer of Care Note  Patient: Carmen Bowen  Procedure(s) Performed: Procedure(s) (LRB) with comments: LAPAROSCOPIC ASSISTED VAGINAL HYSTERECTOMY (N/A) ANTERIOR (CYSTOCELE) AND POSTERIOR REPAIR (RECTOCELE) (N/A) - Posterior repair ONLY BILATERAL SALPINGECTOMY (Bilateral)  Patient Location: PACU  Anesthesia Type:General  Level of Consciousness: awake and alert   Airway & Oxygen Therapy: Patient Spontanous Breathing  Post-op Assessment: Report given to PACU RN  Post vital signs: Reviewed and stable  Complications: No apparent anesthesia complications

## 2012-05-16 ENCOUNTER — Encounter (HOSPITAL_COMMUNITY): Payer: Self-pay | Admitting: Obstetrics and Gynecology

## 2012-05-16 LAB — CBC
HCT: 34.4 % — ABNORMAL LOW (ref 36.0–46.0)
MCH: 30.3 pg (ref 26.0–34.0)
MCV: 93 fL (ref 78.0–100.0)
Platelets: 193 10*3/uL (ref 150–400)
RBC: 3.7 MIL/uL — ABNORMAL LOW (ref 3.87–5.11)
WBC: 9.6 10*3/uL (ref 4.0–10.5)

## 2012-05-16 MED ORDER — IBUPROFEN 600 MG PO TABS: 600.0000 mg | ORAL_TABLET | Freq: Four times a day (QID) | ORAL | Status: DC | PRN

## 2012-05-16 MED ORDER — OXYCODONE-ACETAMINOPHEN 5-325 MG PO TABS: 1.0000 | ORAL_TABLET | ORAL | Status: DC | PRN

## 2012-05-16 NOTE — Discharge Summary (Signed)
Physician Discharge Summary  Patient ID: Carmen Bowen MRN: [id number] DOB/AGE: 52-Oct-1962 52 y.o.  Admit date: 05/15/2012 Discharge date: 05/16/2012  Admission Diagnoses: Pelvic prolapse  Discharge Diagnoses:  Same s/p LAVH/  posterior repair  Discharged Condition:  good  Hospital Course: Pt admitted to observation s/p LAVH Posterior repair.  Uneventful post-op course.  D/c ambulating well, tolerating po, and voiding without problem  Discharge Exam: Blood pressure 108/60, pulse 74, temperature 98.2 F (36.8 C), temperature source Oral, resp. rate 16, height 5' 10.5" (1.791 m), weight 74.39 kg (164 lb), last menstrual period 05/17/2011, SpO2 98.00%. General appearance: alert and cooperative GI: soft and NT with well-approximated incisions  Disposition: 01-Home or Self Care      Discharge Orders    Future Orders Please Complete By Expires   Diet - low sodium heart healthy      Increase activity slowly      Discharge instructions      Comments:   Nothing in vagina for 6 weeks and no heavy lifting for 6 weeks.  Call with fever or nausea/vomiting       Medication List     As of 05/16/2012  9:29 AM    TAKE these medications         cetirizine 10 MG tablet   Commonly known as: ZYRTEC   Take 10 mg by mouth daily.      ibuprofen 600 MG tablet   Commonly known as: ADVIL,MOTRIN   Take 1 tablet (600 mg total) by mouth every 6 (six) hours as needed (mild pain).      ipratropium 0.03 % nasal spray   Commonly known as: ATROVENT   Place 2 sprays into the nose 2 (two) times daily.      multivitamin with minerals Tabs   Take 1 tablet by mouth daily.      oxyCODONE-acetaminophen 5-325 MG per tablet   Commonly known as: PERCOCET/ROXICET   Take 1-2 tablets by mouth every 4 (four) hours as needed (moderate to severe pain (when tolerating fluids)).          SignedOliver Pila 05/16/2012, 9:29 AM

## 2012-05-16 NOTE — Anesthesia Postprocedure Evaluation (Signed)
  Anesthesia Post-op Note  Patient: Carmen Bowen  Procedure(s) Performed: Procedure(s) (LRB) with comments: LAPAROSCOPIC ASSISTED VAGINAL HYSTERECTOMY (N/A) ANTERIOR (CYSTOCELE) AND POSTERIOR REPAIR (RECTOCELE) (N/A) - Posterior repair ONLY BILATERAL SALPINGECTOMY (Bilateral)  Patient Location: [facility]  Anesthesia Type:General  Level of Consciousness: awake  Airway and Oxygen Therapy: Patient Spontanous Breathing  Post-op Pain: mild  Post-op Assessment: Patient's Cardiovascular Status Stable and Respiratory Function Stable  Post-op Vital Signs: stable  Complications: No apparent anesthesia complications

## 2012-05-16 NOTE — Progress Notes (Signed)
Pt is discharged in the care of Mother. Downstairs per ambulatory. Stable Denies any pain or discomfort. Spirits are good Denies voiding difficulty,heavy vaginal bleeding Discharge instructions were given to pt. With good comprehension. Questions were asked and answered.

## 2012-05-16 NOTE — Progress Notes (Signed)
1 Day Post-Op Procedure(s) (LRB): LAPAROSCOPIC ASSISTED VAGINAL HYSTERECTOMY (N/A) ANTERIOR (CYSTOCELE) AND POSTERIOR REPAIR (RECTOCELE) (N/A) BILATERAL SALPINGECTOMY (Bilateral)  Subjective: Patient reports tolerating PO and + flatus.    Objective: I have reviewed patient's vital signs, intake and output and labs.  General: alert and cooperative Abdomen soft and NT Incisions well-healing Vaginal packing removed  Assessment: s/p Procedure(s) (LRB) with comments: LAPAROSCOPIC ASSISTED VAGINAL HYSTERECTOMY (N/A) ANTERIOR (CYSTOCELE) AND POSTERIOR REPAIR (RECTOCELE) (N/A) - Posterior repair ONLY BILATERAL SALPINGECTOMY (Bilateral): stable  Plan: Advance diet Advance to PO medication Discontinue IV fluids Discharge home this pm if voiding fine  LOS: 1 day    Franke Menter W 05/16/2012, 9:24 AM

## 2012-05-17 NOTE — Anesthesia Postprocedure Evaluation (Signed)
Anesthesia Post Note  Patient: Carmen Bowen  Procedure(s) Performed: Procedure(s) (LRB): LAPAROSCOPIC ASSISTED VAGINAL HYSTERECTOMY (N/A) ANTERIOR (CYSTOCELE) AND POSTERIOR REPAIR (RECTOCELE) (N/A) BILATERAL SALPINGECTOMY (Bilateral)  Anesthesia type: General  Patient location: PACU  Post pain: Pain level controlled  Post assessment: Post-op Vital signs reviewed  Post vital signs: Reviewed  Level of consciousness: sedated  Complications: No apparent anesthesia complications

## 2012-08-08 ENCOUNTER — Other Ambulatory Visit: Payer: Self-pay

## 2012-08-08 DIAGNOSIS — Z1231 Encounter for screening mammogram for malignant neoplasm of breast: Secondary | ICD-10-CM

## 2012-09-03 ENCOUNTER — Ambulatory Visit
Admission: RE | Admit: 2012-09-03 | Discharge: 2012-09-03 | Disposition: A | Discharge status: Home / Self Care | Payer: Managed Care, Other (non HMO) | Source: Ambulatory Visit

## 2012-09-03 DIAGNOSIS — Z1231 Encounter for screening mammogram for malignant neoplasm of breast: Secondary | ICD-10-CM

## 2013-01-22 ENCOUNTER — Encounter: Payer: Self-pay | Admitting: Internal Medicine

## 2013-03-28 ENCOUNTER — Ambulatory Visit (AMBULATORY_SURGERY_CENTER): Payer: Self-pay | Admitting: *Deleted

## 2013-03-28 VITALS — Ht 70.5 in | Wt 175.2 lb

## 2013-03-28 DIAGNOSIS — Z1211 Encounter for screening for malignant neoplasm of colon: Secondary | ICD-10-CM

## 2013-03-28 MED ORDER — MOVIPREP 100 G PO SOLR: ORAL | Status: DC

## 2013-03-28 NOTE — Progress Notes (Signed)
No allergies to eggs or soy. No problems with anesthesia.  

## 2013-04-03 ENCOUNTER — Encounter: Payer: Self-pay | Admitting: Internal Medicine

## 2013-04-10 ENCOUNTER — Telehealth: Payer: Self-pay | Admitting: Internal Medicine

## 2013-04-10 NOTE — Telephone Encounter (Signed)
Patient was concerned about eating pecans Saturday and Sunday.  I advised her not to eat anymore nuts and to follow instructions thoroughly and continue with prep as instructed for tomorrow's colonoscopy procedure.  She understand and agreed.

## 2013-04-11 ENCOUNTER — Encounter: Payer: Self-pay | Admitting: Internal Medicine

## 2013-04-11 ENCOUNTER — Ambulatory Visit (AMBULATORY_SURGERY_CENTER): Payer: Managed Care, Other (non HMO) | Admitting: Internal Medicine

## 2013-04-11 VITALS — BP 100/56 | HR 59 | Temp 98.3°F | Resp 16 | Ht 69.0 in | Wt 171.0 lb

## 2013-04-11 DIAGNOSIS — Z1211 Encounter for screening for malignant neoplasm of colon: Secondary | ICD-10-CM

## 2013-04-11 MED ORDER — SODIUM CHLORIDE 0.9 % IV SOLN: 500.0000 mL | INTRAVENOUS | Status: DC

## 2013-04-11 NOTE — Op Note (Signed)
 [facility] [address] [place], [address]   COLONOSCOPY PROCEDURE REPORT  PATIENT: Carmen Bowen, Carmen Bowen  MR#: [id number] BIRTHDATE: 1960-10-18 , 52  yrs. old GENDER: Female ENDOSCOPIST: Hart Carwin, MD REFERRED [id number] Denice Paradise, M.D. PROCEDURE DATE:  04/11/2013 PROCEDURE:   Colonoscopy, screening First Screening Colonoscopy - Avg.  risk and is 50 yrs.  old or older Yes.  Prior Negative Screening - Now for repeat screening. N/A  History of Adenoma - Now for follow-up colonoscopy & has been > or = to 3 yrs.  N/A  Polyps Removed Today? No.  Recommend repeat exam, <10 yrs? No. ASA CLASS:   Class I INDICATIONS:Average risk patient for colon cancer and family history of colon cancer in maternal grandfather and maternal uncle.  mother with colon polyps. MEDICATIONS: MAC sedation, administered by CRNA and propofol (Diprivan) 250mg  IV  DESCRIPTION OF PROCEDURE:   After the risks benefits and alternatives of the procedure were thoroughly explained, informed consent was obtained.  A digital rectal exam revealed no abnormalities of the rectum.   The LB [id number]-[id number]  endoscope was introduced through the anus and advanced to the cecum, which was identified by both the appendix and ileocecal valve. No adverse events experienced.   The quality of the prep was Prepopik excellent  The instrument was then slowly withdrawn as the colon was fully examined.      COLON FINDINGS: There was mild diverticulosis noted in the sigmoid colon with associated muscular hypertrophy.  Retroflexed views revealed no abnormalities. The time to cecum=7 minutes 40 seconds. Withdrawal time=8 minutes 20 seconds.  The scope was withdrawn and the procedure completed. COMPLICATIONS: There were no complications.  ENDOSCOPIC IMPRESSION: There was mild diverticulosis noted in the sigmoid colon  RECOMMENDATIONS: 1.  High fiber diet 2.  recall colonoscopy in 10 years   eSigned:  Hart Carwin, MD 04/11/2013 8:30 AM   cc:   PATIENT NAME:  Carmen Bowen, Carmen Bowen MR#: [id number]

## 2013-04-11 NOTE — Progress Notes (Signed)
Lidocaine-40mg IV prior to Propofol InductionPropofol given over incremental dosages 

## 2013-04-11 NOTE — Patient Instructions (Signed)
YOU HAD AN ENDOSCOPIC PROCEDURE TODAY AT THE [facility]: Refer to the procedure report that was given to you for any specific questions about what was found during the examination.  If the procedure report does not answer your questions, please call your gastroenterologist to clarify.  If you requested that your care partner not be given the details of your procedure findings, then the procedure report has been included in a sealed envelope for you to review at your convenience later.  YOU SHOULD EXPECT: Some feelings of bloating in the abdomen. Passage of more gas than usual.  Walking can help get rid of the air that was put into your GI tract during the procedure and reduce the bloating. If you had a lower endoscopy (such as a colonoscopy or flexible sigmoidoscopy) you may notice spotting of blood in your stool or on the toilet paper. If you underwent a bowel prep for your procedure, then you may not have a normal bowel movement for a few days.  DIET: Your first meal following the procedure should be a light meal and then it is ok to progress to your normal diet.  A half-sandwich or bowl of soup is an example of a good first meal.  Heavy or fried foods are harder to digest and may make you feel nauseous or bloated.  Likewise meals heavy in dairy and vegetables can cause extra gas to form and this can also increase the bloating.  Drink plenty of fluids but you should avoid alcoholic beverages for 24 hours.  ACTIVITY: Your care partner should take you home directly after the procedure.  You should plan to take it easy, moving slowly for the rest of the day.  You can resume normal activity the day after the procedure however you should NOT DRIVE or use heavy machinery for 24 hours (because of the sedation medicines used during the test).    SYMPTOMS TO REPORT IMMEDIATELY: A gastroenterologist can be reached at any hour.  During normal business hours, 8:30 AM to 5:00 PM Monday through Friday,  call [phone].  After hours and on weekends, please call the GI answering service at [phone] who will take a message and have the physician on call contact you.   Following lower endoscopy (colonoscopy or flexible sigmoidoscopy):  Excessive amounts of blood in the stool  Significant tenderness or worsening of abdominal pains  Swelling of the abdomen that is new, acute  Fever of 100F or higher   FOLLOW UP: If any biopsies were taken you will be contacted by phone or by letter within the next 1-3 weeks.  Call your gastroenterologist if you have not heard about the biopsies in 3 weeks.  Our staff will call the home number listed on your records the next business day following your procedure to check on you and address any questions or concerns that you may have at that time regarding the information given to you following your procedure. This is a courtesy call and so if there is no answer at the home number and we have not heard from you through the emergency physician on call, we will assume that you have returned to your regular daily activities without incident.  SIGNATURES/CONFIDENTIALITY: You and/or your care partner have signed paperwork which will be entered into your electronic medical record.  These signatures attest to the fact that that the information above on your After Visit Summary has been reviewed and is understood.  Full responsibility of the confidentiality of   this discharge information lies with you and/or your care-partner.  Diverticulosis, high fiber diet-handouts given  Repeat colonoscopy in 10 years.   

## 2013-04-11 NOTE — Progress Notes (Signed)
Patient denies any allergies to eggs or soy. 

## 2013-04-11 NOTE — Progress Notes (Signed)
Patient did not experience any of the following events: a burn prior to discharge; a fall within the facility; wrong site/side/patient/procedure/implant event; or a hospital transfer or hospital admission upon discharge from the facility. (G8907) Patient did not have preoperative order for IV antibiotic SSI prophylaxis. (G8918)  

## 2013-04-12 ENCOUNTER — Telehealth: Payer: Self-pay | Admitting: *Deleted

## 2013-04-12 NOTE — Telephone Encounter (Signed)
No identifier, left message, follow-up  

## 2014-02-01 ENCOUNTER — Encounter (HOSPITAL_COMMUNITY): Payer: Self-pay | Admitting: Emergency Medicine

## 2014-02-01 ENCOUNTER — Emergency Department (HOSPITAL_COMMUNITY)
Admission: EM | Admit: 2014-02-01 | Discharge: 2014-02-01 | Disposition: A | Discharge status: Home / Self Care | Payer: Managed Care, Other (non HMO) | Source: Home / Self Care | Attending: Family Medicine | Admitting: Family Medicine

## 2014-02-01 ENCOUNTER — Ambulatory Visit (HOSPITAL_COMMUNITY)
Admission: RE | Admit: 2014-02-01 | Discharge: 2014-02-01 | Disposition: A | Discharge status: Home / Self Care | Payer: Managed Care, Other (non HMO) | Source: Ambulatory Visit | Attending: Emergency Medicine | Admitting: Emergency Medicine

## 2014-02-01 DIAGNOSIS — R221 Localized swelling, mass and lump, neck: Secondary | ICD-10-CM | POA: Diagnosis present

## 2014-02-01 DIAGNOSIS — R220 Localized swelling, mass and lump, head: Secondary | ICD-10-CM | POA: Diagnosis not present

## 2014-02-01 NOTE — ED Provider Notes (Signed)
CSN: [id number]     Arrival date & time 02/01/14  1026 History   First MD Initiated Contact with Patient 02/01/14 1111     Chief Complaint  Patient presents with  . Lymphadenopathy   (Consider location/radiation/quality/duration/timing/severity/associated sxs/prior Treatment) HPI Comments: 53 year old female presents complaining of a mass in her neck. For about 3 months she has noticed a small mass on the right side of her neck that has gotten slightly larger over the past couple of weeks. It is firm and nontender. She did not have any similar masses elsewhere. Her history includes pulmonary fibrosis, no other past medical history. She did not have a primary care provider.   Past Medical History  Diagnosis Date  . Pulmonary fibrosis dxx 20 years ago    Sees Dr. Christinia Gully (pulm)  . Seasonal allergies    Past Surgical History  Procedure Laterality Date  . Lung biopsy  1990  . Laparoscopic assisted vaginal hysterectomy  05/15/2012    Procedure: LAPAROSCOPIC ASSISTED VAGINAL HYSTERECTOMY;  Surgeon: Logan Bores, MD;  Location: [facility];  Service: Gynecology;  Laterality: N/A;  . Anterior and posterior repair  05/15/2012    Procedure: ANTERIOR (CYSTOCELE) AND POSTERIOR REPAIR (RECTOCELE);  Surgeon: Logan Bores, MD;  Location: [facility];  Service: Gynecology;  Laterality: N/A;  Posterior repair ONLY  . Bilateral salpingectomy  05/15/2012    Procedure: BILATERAL SALPINGECTOMY;  Surgeon: Logan Bores, MD;  Location: [facility];  Service: Gynecology;  Laterality: Bilateral;   Family History  Problem Relation Age of Onset  . Colon cancer Maternal Uncle 53  . Colon cancer Maternal Grandfather     unsure age of onset  . Colon polyps Mother    History  Substance Use Topics  . Smoking status: Former Smoker    Quit date: 05/14/1978  . Smokeless tobacco: Never Used  . Alcohol Use: No   OB History   Grav Para Term Preterm Abortions TAB SAB Ect Mult Living                 Review of  Systems  Skin:       See history of present illness  All other systems reviewed and are negative.   Allergies  Nitrofurantoin monohyd macro  Home Medications   Prior to Admission medications   Medication Sig Start Date End Date Taking? Authorizing Provider  cetirizine (ZYRTEC) 10 MG tablet Take 10 mg by mouth daily.    Historical Provider, MD  ibuprofen (ADVIL,MOTRIN) 600 MG tablet Take 1 tablet (600 mg total) by mouth every 6 (six) hours as needed (mild pain). 05/16/12   Logan Bores, MD  ipratropium (ATROVENT) 0.03 % nasal spray Place 2 sprays into the nose 2 (two) times daily. 02/01/12   Eleanore Kurtis Bushman, PA-C  Multiple Vitamin (MULTIVITAMIN WITH MINERALS) TABS Take 1 tablet by mouth daily.    Historical Provider, MD  naproxen sodium (ANAPROX) 220 MG tablet Take 220 mg by mouth 2 (two) times daily with a meal.    Historical Provider, MD   BP 130/82  Pulse 90  Temp(Src) 98.2 F (36.8 C) (Oral)  Resp 16  SpO2 100%  LMP 05/17/2011 Physical Exam  Nursing note and vitals reviewed. Constitutional: She is oriented to person, place, and time. Vital signs are normal. She appears well-developed and well-nourished. No distress.  HENT:  Head: Normocephalic and atraumatic.  Neck:    Pulmonary/Chest: Effort normal. No respiratory distress.  Neurological: She is alert and oriented to person,  place, and time. She has normal strength. Coordination normal.  Skin: Skin is warm and dry. No rash noted. She is not diaphoretic.  Psychiatric: She has a normal mood and affect. Judgment normal.    ED Course  Procedures (including critical care time) Labs Review Labs Reviewed - No data to display  Imaging Review US Soft Tissue Head/neck  02/01/2014   CLINICAL DATA:  Palpable abnormality in the right submandibular region for 2 months.  EXAM: ULTRASOUND OF HEAD/NECK SOFT TISSUES  TECHNIQUE: Ultrasound examination of the head and neck soft tissues was performed in the area of clinical concern.   COMPARISON:  None.  FINDINGS: In the right submandibular region in the area of palpable abnormality, there is a small hypoechoic nodule which measures 0.8 x 0.6 x 0.7 cm. This appears largely anechoic with small septations. There is no associated blood flow on Doppler evaluation. The appearance favors a small cluster of cysts although a small lymph node could have a similar appearance. This appears superficial to the fascia and deep to the skin. There is no evidence for abscess or solid mass.  IMPRESSION: 1. Palpable abnormality represents a small 0.8 cm probable cluster of cysts versus small lymph node. 2. Location of the abnormality is in the right submandibular region in the subcutaneous tissues superficial to the fascia.   Electronically Signed   By: Shon Hale M.D.   On: 02/01/2014 14:25     MDM   1. Mass in neck    This is most likely a dermal cyst. We'll get an ultrasound as initial evaluation, I will call her with results.  Ultrasound read as probable cluster cyst versus lymph node, these results were conveyed to the patient. She has a dermatology appointment are scheduled in 2 months, she will followup there to discuss potential need for needle biopsy if this lesion persists     Liam Graham, PA-C 02/01/14 1518

## 2014-02-01 NOTE — ED Notes (Signed)
PT   HAS  SOME  SWELLING   R  SIDE  NECK  /  THROAT  AREA     -      NO  PAIN      -  AIRWAY IS  INTACT  -  PT  SITTING  UPRIGHT ON  EXAM TABLE  SPEAKING IN  CLEAR  COMPLETE  SENTANCES           PT  REPORTS  THE  SWELLING  IS  BECOMING  LARGER

## 2014-02-02 NOTE — ED Provider Notes (Signed)
Medical screening examination/treatment/procedure(s) were performed by resident physician or non-physician practitioner and as supervising physician I was immediately available for consultation/collaboration.   Pauline Good MD.   Billy Fischer, MD 02/02/14 [phone]

## 2014-09-04 ENCOUNTER — Other Ambulatory Visit: Payer: Self-pay | Admitting: Otolaryngology

## 2015-01-27 ENCOUNTER — Other Ambulatory Visit: Payer: Self-pay | Admitting: Foot & Ankle Surgery

## 2015-01-27 DIAGNOSIS — M76822 Posterior tibial tendinitis, left leg: Secondary | ICD-10-CM

## 2015-02-12 ENCOUNTER — Other Ambulatory Visit: Payer: Managed Care, Other (non HMO)

## 2015-06-01 ENCOUNTER — Emergency Department (HOSPITAL_COMMUNITY)
Admission: EM | Admit: 2015-06-01 | Discharge: 2015-06-01 | Disposition: A | Discharge status: Home / Self Care | Payer: 59 | Source: Home / Self Care | Attending: Family Medicine | Admitting: Family Medicine

## 2015-06-01 ENCOUNTER — Encounter (HOSPITAL_COMMUNITY): Payer: Self-pay | Admitting: *Deleted

## 2015-06-01 DIAGNOSIS — J0101 Acute recurrent maxillary sinusitis: Secondary | ICD-10-CM

## 2015-06-01 MED ORDER — DOXYCYCLINE HYCLATE 100 MG PO CAPS: 100.0000 mg | ORAL_CAPSULE | Freq: Two times a day (BID) | ORAL | Status: DC

## 2015-06-01 NOTE — Discharge Instructions (Signed)
Drink plenty of fluids as discussed, use medicine as prescribed, and mucinex and saline rinses as much as possible. Return or see your doctor if further problems

## 2015-06-01 NOTE — ED Notes (Signed)
Pt  Reports  Symptoms  Of  Facial  Pain  /  Nasal  Congestion     -  Symptoms  X  3  Days          -     Symptoms          Not  releived      otc  meds          Sitting  Upright  On   Exam table  Speaking  In  Complete  sentances            Takes   Zyrtec   otc

## 2015-06-01 NOTE — ED Provider Notes (Addendum)
CSN: [id number]     Arrival date & time 06/01/15  1732 History   First MD Initiated Contact with Patient 06/01/15 1941     Chief Complaint  Patient presents with  . Facial Pain   (Consider location/radiation/quality/duration/timing/severity/associated sxs/prior Treatment) Patient is a 55 y.o. female presenting with URI. The history is provided by the patient.  URI Presenting symptoms: congestion, facial pain and rhinorrhea   Presenting symptoms: no cough, no fever and no sore throat   Severity:  Moderate Onset quality:  Gradual Duration:  1 week Progression:  Unchanged Chronicity:  New Relieved by:  Nothing Associated symptoms: sinus pain     Past Medical History  Diagnosis Date  . Pulmonary fibrosis ([place]) dxx 20 years ago    Sees Dr. Christinia Gully (pulm)  . Seasonal allergies    Past Surgical History  Procedure Laterality Date  . Lung biopsy  1990  . Laparoscopic assisted vaginal hysterectomy  05/15/2012    Procedure: LAPAROSCOPIC ASSISTED VAGINAL HYSTERECTOMY;  Surgeon: Logan Bores, MD;  Location: [facility];  Service: Gynecology;  Laterality: N/A;  . Anterior and posterior repair  05/15/2012    Procedure: ANTERIOR (CYSTOCELE) AND POSTERIOR REPAIR (RECTOCELE);  Surgeon: Logan Bores, MD;  Location: [facility];  Service: Gynecology;  Laterality: N/A;  Posterior repair ONLY  . Bilateral salpingectomy  05/15/2012    Procedure: BILATERAL SALPINGECTOMY;  Surgeon: Logan Bores, MD;  Location: [facility];  Service: Gynecology;  Laterality: Bilateral;   Family History  Problem Relation Age of Onset  . Colon cancer Maternal Uncle 52  . Colon cancer Maternal Grandfather     unsure age of onset  . Colon polyps Mother    Social History  Substance Use Topics  . Smoking status: Former Smoker    Quit date: 05/14/1978  . Smokeless tobacco: Never Used  . Alcohol Use: No   OB History    No data available     Review of Systems  Constitutional: Negative.  Negative for fever and  chills.  HENT: Positive for congestion, postnasal drip, rhinorrhea and sinus pressure. Negative for facial swelling and sore throat.   Respiratory: Negative for cough.   All other systems reviewed and are negative.   Allergies  Nitrofurantoin monohyd macro  Home Medications   Prior to Admission medications   Medication Sig Start Date End Date Taking? Authorizing Provider  cetirizine (ZYRTEC) 10 MG tablet Take 10 mg by mouth daily.    Historical Provider, MD  doxycycline (VIBRAMYCIN) 100 MG capsule Take 1 capsule (100 mg total) by mouth 2 (two) times daily. 06/01/15   Billy Fischer, MD  ibuprofen (ADVIL,MOTRIN) 600 MG tablet Take 1 tablet (600 mg total) by mouth every 6 (six) hours as needed (mild pain). 05/16/12   Paula Compton, MD  ipratropium (ATROVENT) 0.03 % nasal spray Place 2 sprays into the nose 2 (two) times daily. 02/01/12   Eleanore Kurtis Bushman, PA-C  Multiple Vitamin (MULTIVITAMIN WITH MINERALS) TABS Take 1 tablet by mouth daily.    Historical Provider, MD  naproxen sodium (ANAPROX) 220 MG tablet Take 220 mg by mouth 2 (two) times daily with a meal.    Historical Provider, MD   Meds Ordered and Administered this Visit  Medications - No data to display  BP 132/82 mmHg  Pulse 63  Temp(Src) 97.9 F (36.6 C) (Oral)  Resp 16  SpO2 97%  LMP 05/17/2011 No data found.   Physical Exam  Constitutional: She is oriented to person, place,  and time. She appears well-developed and well-nourished. No distress.  HENT:  Right Ear: External ear normal.  Left Ear: External ear normal.  Nose: Mucosal edema and rhinorrhea present.  Mouth/Throat: Oropharynx is clear and moist.  Neck: Normal range of motion. Neck supple.  Lymphadenopathy:    She has no cervical adenopathy.  Neurological: She is alert and oriented to person, place, and time.  Skin: Skin is warm and dry.  Nursing note and vitals reviewed.   ED Course  Procedures (including critical care time)  Labs Review Labs Reviewed  - No data to display  Imaging Review No results found.   Visual Acuity Review  Right Eye Distance:   Left Eye Distance:   Bilateral Distance:    Right Eye Near:   Left Eye Near:    Bilateral Near:         MDM   1. Acute recurrent maxillary sinusitis    Meds ordered this encounter  Medications  . doxycycline (VIBRAMYCIN) 100 MG capsule    Sig: Take 1 capsule (100 mg total) by mouth 2 (two) times daily.    Dispense:  24 capsule    Refill:  0       Billy Fischer, MD 06/01/15 2014  Billy Fischer, MD 06/01/15 2014

## 2017-02-15 ENCOUNTER — Other Ambulatory Visit: Payer: Self-pay | Admitting: Obstetrics and Gynecology

## 2017-02-15 DIAGNOSIS — Z1231 Encounter for screening mammogram for malignant neoplasm of breast: Secondary | ICD-10-CM

## 2017-06-29 ENCOUNTER — Other Ambulatory Visit: Payer: Self-pay | Admitting: Obstetrics and Gynecology

## 2017-06-29 DIAGNOSIS — E2839 Other primary ovarian failure: Secondary | ICD-10-CM

## 2017-07-06 ENCOUNTER — Ambulatory Visit
Admission: RE | Admit: 2017-07-06 | Discharge: 2017-07-06 | Disposition: A | Discharge status: Home / Self Care | Payer: 59 | Source: Ambulatory Visit | Attending: Obstetrics and Gynecology | Admitting: Obstetrics and Gynecology

## 2017-07-06 DIAGNOSIS — E2839 Other primary ovarian failure: Secondary | ICD-10-CM

## 2018-05-30 ENCOUNTER — Encounter: Payer: Self-pay | Admitting: Internal Medicine

## 2018-05-30 ENCOUNTER — Ambulatory Visit: Payer: 59 | Admitting: Internal Medicine

## 2018-05-30 ENCOUNTER — Ambulatory Visit (INDEPENDENT_AMBULATORY_CARE_PROVIDER_SITE_OTHER)
Admission: RE | Admit: 2018-05-30 | Discharge: 2018-05-30 | Disposition: A | Discharge status: Home / Self Care | Payer: 59 | Source: Ambulatory Visit | Attending: Internal Medicine | Admitting: Internal Medicine

## 2018-05-30 VITALS — BP 124/70 | HR 82 | Ht 69.0 in | Wt 184.0 lb

## 2018-05-30 DIAGNOSIS — R0609 Other forms of dyspnea: Secondary | ICD-10-CM | POA: Insufficient documentation

## 2018-05-30 DIAGNOSIS — R002 Palpitations: Secondary | ICD-10-CM | POA: Diagnosis not present

## 2018-05-30 DIAGNOSIS — R918 Other nonspecific abnormal finding of lung field: Secondary | ICD-10-CM | POA: Diagnosis not present

## 2018-05-30 DIAGNOSIS — I10 Essential (primary) hypertension: Secondary | ICD-10-CM | POA: Insufficient documentation

## 2018-05-30 NOTE — Progress Notes (Signed)
Carmen Bowen, female    DOB: 20-May-1960, 58 y.o.   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  (records req) in the 1990s self referred to pulmonary clinic 05/30/2018 for new onset palpitations    History of Present Illness  05/30/2018  Pulmonary/ 1st office eval/Destine Zirkle  Chief Complaint  Patient presents with  . Pulmonary Consult    Self referral. She states had a couple of episodes of heart palpitations last wk. She states her chest "felt funny".  She has noticed some SOB over the past month when she sings. She states she constant PND and has spit up some some green sputum recently.   Dyspnea:  Last worked out one week before [employer] got busy.  Two miles on gxt 2 4.0 mph and 1 degrees Cough: none Sleep: flat ok never noct palp. SABA use: none   New onset one week prior to OV  = 3 episodes of palp they all occurred at rest, lasted one min resolved spont, none with presyncope, cp, or sob. Does drink mountain dew, no excess coffee, tea, decongestants or street drugs/ vapes  Since stopped mountain dew palpitations resolved   No obvious patterins in day to day or daytime variability or assoc excess/ purulent sputum or mucus plugs or hemoptysis or cp or chest tightness, subjective wheeze or overt sinus or hb symptoms.   Sleeping flat ok  without nocturnal  or early am exacerbation  of respiratory  c/o's or need for noct saba. Also denies any obvious fluctuation of symptoms with weather or environmental changes or other aggravating or alleviating factors except as outlined above   No unusual exposure hx or h/o childhood pna/ asthma or knowledge of premature birth.  Current Allergies, Complete Past Medical History, Past Surgical History, Family History, and Social History were reviewed in [employer] record.  ROS  The following are not active complaints unless bolded Hoarseness, sore throat,  dysphagia, dental problems, itching, sneezing,  nasal congestion or discharge of excess mucus or purulent secretions, ear ache,   fever, chills, sweats, unintended wt loss or wt gain, classically pleuritic or exertional cp,  orthopnea pnd or arm/hand swelling  or leg swelling, presyncope, palpitations, abdominal pain, anorexia, nausea, vomiting, diarrhea  or change in bowel habits or change in bladder habits, change in stools or change in urine, dysuria, hematuria,  rash, arthralgias, visual complaints, headache, numbness, weakness or ataxia or problems with walking or coordination,  change in mood or  memory.               Past Medical History:  Diagnosis Date  . Pulmonary fibrosis ([place]) dxx 20 years ago   Sees Dr. Christinia Gully (pulm)  . Seasonal allergies     Outpatient Medications Prior to Visit  Medication Sig Dispense Refill  . cetirizine (ZYRTEC) 10 MG tablet Take 10 mg by mouth daily.    . Cholecalciferol (VITAMIN D3) 1.25 MG (50000 UT) CAPS Take 1 capsule by mouth once a week.    . Multiple Vitamin (MULTIVITAMIN WITH MINERALS) TABS Take 1 tablet by mouth daily.    . naproxen sodium (ANAPROX) 220 MG tablet Take 220 mg by mouth daily as needed.     . doxycycline (VIBRAMYCIN) 100 MG capsule Take 1 capsule (100 mg total) by mouth 2 (two) times daily. 24 capsule 0  . ibuprofen (ADVIL,MOTRIN) 600 MG tablet Take 1 tablet (600 mg total)  by mouth every 6 (six) hours as needed (mild pain). 30 tablet 1  . ipratropium (ATROVENT) 0.03 % nasal spray Place 2 sprays into the nose 2 (two) times daily. 30 mL 0   No facility-administered medications prior to visit.      Objective:     BP 124/70 (BP Location: Left Arm, Cuff Size: Normal)   Pulse 82   Ht 5\' 9"  (1.753 m)   Wt 184 lb (83.5 kg)   LMP 05/17/2011   SpO2 99%   BMI 27.17 kg/m   SpO2: 99 % RA   HEENT: nl dentition, turbinates bilaterally, and oropharynx. Nl external ear canals without cough reflex   NECK :  without  JVD/Nodes/TM/ nl carotid upstrokes bilaterally   LUNGS: no acc muscle use,  Nl contour chest which is clear to A and P bilaterally without cough on insp or exp maneuvers   CV:  RRR  no s3 or murmur or increase in P2, and no edema   ABD:  soft and nontender with nl inspiratory excursion in the supine position. No bruits or organomegaly appreciated, bowel sounds nl  MS:  Nl gait/ ext warm without deformities, calf tenderness, cyanosis or clubbing No obvious joint restrictions   SKIN: warm and dry without lesions    NEURO:  alert, approp, nl sensorium with  no motor or cerebellar deficits apparent.     Labs ordered/ reviewed:      Chemistry      Component Value Date/Time   NA 139 05/30/2018 1652   K 4.0 05/30/2018 1652   CL 102 05/30/2018 1652   CO2 32 05/30/2018 1652   BUN 18 05/30/2018 1652   CREATININE 0.73 05/30/2018 1652      Component Value Date/Time   CALCIUM 9.5 05/30/2018 1652        Lab Results  Component Value Date   WBC 5.0 05/30/2018   HGB 13.4 05/30/2018   HCT 39.6 05/30/2018   MCV 89.5 05/30/2018   PLT 201.0 05/30/2018         Lab Results  Component Value Date   TSH 2.73 05/30/2018     Lab Results  Component Value Date   PROBNP 68.0 05/30/2018       Lab Results  Component Value Date   ESRSEDRATE 3 05/30/2018        ekg 05/30/2018    Nsr/ nl pr and  wpw changes   CXR PA and Lateral:   05/30/2018 :    I personally reviewed images and agree with radiology impression as follows:   1. Asymmetric ill-defined nodular opacity in right perihilar region. Pulmonary neoplasm cannot be excluded. Recommend chest CT without contrast contrast for further evaluation. 2. Bilateral upper lobe scarring with superior hilar retraction      Assessment   Palpitations New onset 05/2018 ? Better p stopped mountain dew  No assoc symptoms and never occur with exertion or noct so probably benign but if continue to recur off all stimulants needs 48 h  holter >>> in  meantime safe to resume submax ex.   DOE (dyspnea on exertion) No evidence of heart dz/ anemia/ thyrod dz.    Pulmonary infiltrates on CXR eval in 1990s in pulmonary clinic > records req  - CT chest s contrast ordered   Unfortunately don't have baseline cxr to compare but will look at prior records and check CT chest to be complete but most likely this is either mild bronchiectasis or just parenchymal scarring from previous lung  injury related to hairdressing.   Discussed in detail all the  indications, usual  risks and alternatives  relative to the benefits with patient who agrees to proceed with w/u as outlined.        Total time devoted to counseling  > 50 % of initial 60 min office visit:  review case with pt/ discussion of options/alternatives/ personally creating written customized instructions  in presence of pt  then going over those specific  Instructions directly with the pt including how to use all of the meds but in particular covering each new medication in detail and the difference between the maintenance= "automatic" meds and the prns using an action plan format for the latter (If this problem/symptom => do that organization reading Left to right).  Please see AVS from this visit for a full list of these instructions which I personally wrote for this pt and  are unique to this visit.        Christinia Gully, MD 05/30/2018

## 2018-05-30 NOTE — Patient Instructions (Signed)
Stop mountain dew and all decongestants/ caffeine  Please remember to go to the lab and x-ray department   for your tests - we will call you with the results when they are available.  Regular sub maximal exercise -  Call immediately light headed, chest pain with palpitations or short of breath

## 2018-05-31 ENCOUNTER — Telehealth: Payer: Self-pay | Admitting: Internal Medicine

## 2018-05-31 ENCOUNTER — Encounter: Payer: Self-pay | Admitting: Internal Medicine

## 2018-05-31 DIAGNOSIS — R918 Other nonspecific abnormal finding of lung field: Secondary | ICD-10-CM | POA: Insufficient documentation

## 2018-05-31 LAB — CBC WITH DIFFERENTIAL/PLATELET
Basophils Absolute: 0 10*3/uL (ref 0.0–0.1)
Basophils Relative: 0.7 % (ref 0.0–3.0)
EOS ABS: 0.2 10*3/uL (ref 0.0–0.7)
Eosinophils Relative: 3.8 % (ref 0.0–5.0)
HCT: 39.6 % (ref 36.0–46.0)
Hemoglobin: 13.4 g/dL (ref 12.0–15.0)
Lymphocytes Relative: 43.9 % (ref 12.0–46.0)
Lymphs Abs: 2.2 10*3/uL (ref 0.7–4.0)
MCHC: 33.8 g/dL (ref 30.0–36.0)
MCV: 89.5 fl (ref 78.0–100.0)
Monocytes Absolute: 0.3 10*3/uL (ref 0.1–1.0)
Monocytes Relative: 6.5 % (ref 3.0–12.0)
Neutro Abs: 2.3 10*3/uL (ref 1.4–7.7)
Neutrophils Relative %: 45.1 % (ref 43.0–77.0)
Platelets: 201 10*3/uL (ref 150.0–400.0)
RBC: 4.42 Mil/uL (ref 3.87–5.11)
RDW: 12.8 % (ref 11.5–15.5)
WBC: 5 10*3/uL (ref 4.0–10.5)

## 2018-05-31 LAB — BASIC METABOLIC PANEL
BUN: 18 mg/dL (ref 6–23)
CO2: 32 mEq/L (ref 19–32)
Calcium: 9.5 mg/dL (ref 8.4–10.5)
Chloride: 102 mEq/L (ref 96–112)
Creatinine, Ser: 0.73 mg/dL (ref 0.40–1.20)
GFR: 82.02 mL/min (ref >60.00)
Glucose, Bld: 85 mg/dL (ref 70–99)
POTASSIUM: 4 meq/L (ref 3.5–5.1)
Sodium: 139 mEq/L (ref 135–145)

## 2018-05-31 LAB — TSH: TSH: 2.73 u[IU]/mL (ref 0.35–4.50)

## 2018-05-31 LAB — SEDIMENTATION RATE: Sed Rate: 3 mm/hr (ref 0–30)

## 2018-05-31 LAB — BRAIN NATRIURETIC PEPTIDE: PRO B NATRI PEPTIDE: 68 pg/mL (ref 0.0–100.0)

## 2018-05-31 NOTE — Assessment & Plan Note (Signed)
eval in 1990s in pulmonary clinic > records req  - CT chest s contrast ordered   Unfortunately don't have baseline cxr to compare but will look at prior records and check CT chest to be complete but most likely this is either mild bronchiectasis or just parenchymal scarring from previous lung injury related to hairdressing.     Discussed in detail all the  indications, usual  risks and alternatives  relative to the benefits with patient who agrees to proceed with w/u as outlined.       Total time devoted to counseling  > 50 % of initial 60 min office visit:  review case with pt/ discussion of options/alternatives/ personally creating written customized instructions  in presence of pt  then going over those specific  Instructions directly with the pt including how to use all of the meds but in particular covering each new medication in detail and the difference between the maintenance= "automatic" meds and the prns using an action plan format for the latter (If this problem/symptom => do that organization reading Left to right).  Please see AVS from this visit for a full list of these instructions which I personally wrote for this pt and  are unique to this visit.

## 2018-05-31 NOTE — Assessment & Plan Note (Signed)
New onset 05/2018 ? Better p stopped mountain dew  No assoc symptoms and never occur with exertion or noct so probably benign but if continue to recur off all stimulants needs 48 h holter  In meantime safe to resume submax ex.

## 2018-05-31 NOTE — Telephone Encounter (Signed)
Yes I sent an email to Blondell Reveal. Nothing further is needed and will follow up on this after she responds.

## 2018-05-31 NOTE — Assessment & Plan Note (Signed)
No evidence of heart dz/ anemia/ thyrod dz.

## 2018-05-31 NOTE — Telephone Encounter (Signed)
Called patient and advised of results I will follow up with Jonelle Sidle to see if she has requested the paper charts yet? Jonelle Sidle Please advise.

## 2018-06-01 ENCOUNTER — Other Ambulatory Visit: Payer: Self-pay | Admitting: Internal Medicine

## 2018-06-01 DIAGNOSIS — R911 Solitary pulmonary nodule: Secondary | ICD-10-CM

## 2018-06-01 NOTE — Telephone Encounter (Signed)
Blondell Reveal  [facility] 06/01/2018 12:56 PM  Quentin Ore ?  Hi Malvin Morrish, Yes I will order the chart today and it should be here on Monday. From: Quentin Ore [email or web address]>  Sent: Thursday, May 31, 2018 5:27 PM To: Blondell Reveal [email or web address]> Subject: Paper chart  Will route to Graham to make sure the paper chart is received and given to MW.

## 2018-06-05 NOTE — Telephone Encounter (Signed)
   chart  ?   ?  ?  ?  ?  ?  Blondell Reveal  [facility] 06/04/2018 4:30 PM  Quentin Ore ?  Hi Doyce Saling, IM could not find this chart but, they are researching it some more and if they find it, the chart will be here tomorrow 06/05/18 also ask Rodena Piety to let you know. ?  [place], thanks.Create reply with [facility], thanks.Ok, thank you.Create reply with [facility], thank you.Will do, thanks!Create reply with Will do, thanks!

## 2018-06-27 ENCOUNTER — Ambulatory Visit (INDEPENDENT_AMBULATORY_CARE_PROVIDER_SITE_OTHER)
Admission: RE | Admit: 2018-06-27 | Discharge: 2018-06-27 | Disposition: A | Discharge status: Home / Self Care | Payer: 59 | Source: Ambulatory Visit | Attending: Internal Medicine | Admitting: Internal Medicine

## 2018-06-27 DIAGNOSIS — R911 Solitary pulmonary nodule: Secondary | ICD-10-CM

## 2018-06-28 ENCOUNTER — Other Ambulatory Visit: Payer: Self-pay | Admitting: Internal Medicine

## 2018-06-28 ENCOUNTER — Telehealth: Payer: Self-pay | Admitting: Internal Medicine

## 2018-06-28 DIAGNOSIS — R0609 Other forms of dyspnea: Principal | ICD-10-CM

## 2018-06-28 NOTE — Progress Notes (Signed)
Spoke with pt and notified of results per Dr. Wert. Pt verbalized understanding and denied any questions. 

## 2018-06-28 NOTE — Telephone Encounter (Signed)
Call patient : Carmen Bowen is shows only scarring of bronchial tubes I suspected from prior lung injury in th 1990s and not an active problem. There was slt elevation of R ht pressures suggested which may be artifactual but to closed the loop rec echo and ov a day later to review all the studies at once in detail and come up with a longterm f/u plan   Patient wanted to go back over these results again nothing further needed.

## 2018-07-02 ENCOUNTER — Ambulatory Visit (HOSPITAL_COMMUNITY): Payer: 59 | Attending: Cardiology

## 2018-07-02 DIAGNOSIS — R0609 Other forms of dyspnea: Secondary | ICD-10-CM | POA: Diagnosis present

## 2018-07-04 ENCOUNTER — Encounter: Payer: Self-pay | Admitting: Internal Medicine

## 2018-07-04 ENCOUNTER — Ambulatory Visit (INDEPENDENT_AMBULATORY_CARE_PROVIDER_SITE_OTHER): Payer: 59 | Admitting: Internal Medicine

## 2018-07-04 VITALS — BP 150/88 | HR 77 | Ht 69.0 in | Wt 182.2 lb

## 2018-07-04 DIAGNOSIS — R0609 Other forms of dyspnea: Secondary | ICD-10-CM | POA: Diagnosis not present

## 2018-07-04 DIAGNOSIS — R002 Palpitations: Secondary | ICD-10-CM

## 2018-07-04 DIAGNOSIS — R918 Other nonspecific abnormal finding of lung field: Secondary | ICD-10-CM

## 2018-07-04 MED ORDER — METOPROLOL TARTRATE 25 MG PO TABS: 25.0000 mg | ORAL_TABLET | Freq: Two times a day (BID) | ORAL | 2 refills | Status: DC

## 2018-07-04 NOTE — Progress Notes (Signed)
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 Pulmonary Consult    Self referral. She states had a couple of episodes of heart palpitations last wk. She states her chest "felt funny".  She has noticed some SOB over the past month when she sings. She states she constant PND and has spit up some some green sputum recently.   Dyspnea:  Last worked out one week before [employer] got busy.  Two miles on gxt    4.0 mph and 1 degrees and then elipitical for last mile  Cough: none Sleep: flat ok never noct palp. SABA use: none New onset one week prior to OV  = 3 episodes of palp they all occurred at rest, lasted one min resolved spont, none with presyncope, cp, or sob. Does drink mountain dew, no excess coffee, tea, decongestants or street drugs/ vapes Since stopped mountain dew palpitations resolved rec Stop mountain dew and all decongestants/ caffeine Regular sub maximal exercise -  Call immediately light headed, chest pain with palpitations or short of breath Check labs:  wnl    07/04/2018  f/u ov/Carmen Bowen re: bronchiectasis / palpitations  Chief Complaint  Patient presents with  . Follow-up    Breathing is unchanged since her last visit. She states she had one episode of increased BP and heart rate while resting.   Dyspnea:  Not limited by breathing from desired activities  But limited by back Cough: no Sleeping: poorly staying some with dad with lung ca SABA use: none One episode x 5 min s sob, fluttering in chest, no syncope/ swearing nausea  Has occ hb/ not bad enough to treat   No obvious day to day or daytime variability or assoc excess/ purulent sputum or  mucus plugs or hemoptysis or cp or chest tightness, subjective wheeze or overt sinus or hb symptoms.   Sleeping flat  without nocturnal  or early am exacerbation  of respiratory  c/o's or need for noct saba. Also denies any obvious fluctuation of symptoms with weather or environmental changes or other aggravating or alleviating factors except as outlined above   No unusual exposure hx or h/o childhood pna/ asthma or knowledge of premature birth.  Current Allergies, Complete Past Medical History, Past Surgical History, Family History, and Social History were reviewed in [employer] record.  ROS  The following are not active complaints unless bolded Hoarseness, sore throat, dysphagia, dental problems, itching, sneezing,  nasal congestion or discharge of excess mucus or purulent secretions, ear ache,   fever, chills, sweats, unintended wt loss or wt gain, classically pleuritic or exertional cp,  orthopnea pnd or arm/hand swelling  or leg swelling, presyncope, palpitations, abdominal pain, anorexia, nausea, vomiting, diarrhea  or change in bowel habits or change in bladder habits, change in stools or change in urine, dysuria, hematuria,  rash, arthralgias, visual complaints, headache, numbness, weakness or ataxia or problems with walking or coordination,  change in mood or  memory.        Current Meds  Medication Sig  . cetirizine (ZYRTEC) 10 MG tablet Take 10 mg by mouth daily.  . Cholecalciferol (VITAMIN  D3) 1.25 MG (50000 UT) CAPS Take 1 capsule by mouth once a week.  . Multiple Vitamin (MULTIVITAMIN WITH MINERALS) TABS Take 1 tablet by mouth daily.  . naproxen sodium (ANAPROX) 220 MG tablet Take 220 mg by mouth daily as needed.                Objective:     amb wf nad  Wt Readings from Last 3 Encounters:  07/04/18 182 lb 3.2 oz (82.6 kg)  05/30/18 184 lb (83.5 kg)  04/11/13 171 lb (77.6 kg)     Vital signs reviewed - Note on arrival 02 sats  94% on RA and BP  150/88 and Pulse 77      HEENT: nl dentition, turbinates bilaterally, and oropharynx. Nl external ear canals without cough reflex   NECK :  without JVD/Nodes/TM/ nl carotid upstrokes bilaterally   LUNGS: no acc muscle use,  Nl contour chest which is clear to A and P bilaterally without cough on insp or exp maneuvers   CV:  RRR  no s3 or murmur or increase in P2, and no edema   ABD:  soft and nontender with nl inspiratory excursion in the supine position. No bruits or organomegaly appreciated, bowel sounds nl  MS:  Nl gait/ ext warm without deformities, calf tenderness, cyanosis or clubbing No obvious joint restrictions   SKIN: warm and dry without lesions    NEURO:  alert, approp, nl sensorium with  no motor or cerebellar deficits apparent.         Assessment

## 2018-07-04 NOTE — Patient Instructions (Addendum)
Lopressor 25 mg twice daily    Please see patient coordinator before you leave today  to schedule cardiology evaluation   Please schedule a follow up visit in 3 months but call sooner if needed

## 2018-07-08 ENCOUNTER — Encounter: Payer: Self-pay | Admitting: Internal Medicine

## 2018-07-08 NOTE — Assessment & Plan Note (Addendum)
New onset 05/2018 ? Better p stopped mountain dew Echo done 07/02/18 wnl  - added lopressor 25 mg bid 07/04/2018 as bp on high side  - referred to cards 07/04/2018   bp up with nl echo > start lopressor low dose now   With baseline now - episodes but not even once every 48 h will also  refer to cards ? Needs 30 day recorder? Or just follow prn flare on BB ?   Discussed in detail all the  indications, usual  risks and alternatives  relative to the benefits with patient who agrees to proceed with w/u as outlined.

## 2018-07-08 NOTE — Assessment & Plan Note (Signed)
eval in 1990s in pulmonary clinic ? 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 Bilateral perihilar bronchiectasis, architectural distortion and scarring may be post infectious in etiology. Sarcoid is not excluded but is considered less likely in the absence of other corroborating findings. 2. Enlarged pulmonary arteries, indicative of pulmonary arterial Hypertension > Echo done 07/02/18 wnl  3. Moderate hiatal hernia   Reviewed ct findings and pathophysiology of bronchiectasis using escalator analogy, no need for rx at this point   F/u at 3 m

## 2018-07-08 NOTE — Assessment & Plan Note (Addendum)
Echo done 07/02/18 wnl so does not have signicant PH as suggested by radiology.  More limited by back than breathing at this point   Will do full pfts on return but no need for additional w/u at this point   I had an extended discussion with the patient and sister reviewing all relevant studies completed to date and  lasting 15 to 20 minutes of a 25 minute visit    Each maintenance medication was reviewed in detail including most importantly the difference between maintenance and prns and under what circumstances the prns are to be triggered using an action plan format that is not reflected in the computer generated alphabetically organized AVS.     Please see AVS for specific instructions unique to this visit that I personally wrote and verbalized to the the pt in detail and then reviewed with pt  by my nurse highlighting any  changes in therapy recommended at today's visit to their plan of care.

## 2018-10-04 ENCOUNTER — Encounter: Payer: Self-pay | Admitting: Internal Medicine

## 2018-10-04 ENCOUNTER — Other Ambulatory Visit: Payer: Self-pay

## 2018-10-04 ENCOUNTER — Ambulatory Visit (INDEPENDENT_AMBULATORY_CARE_PROVIDER_SITE_OTHER): Payer: 59 | Admitting: Internal Medicine

## 2018-10-04 DIAGNOSIS — R002 Palpitations: Secondary | ICD-10-CM

## 2018-10-04 DIAGNOSIS — R918 Other nonspecific abnormal finding of lung field: Secondary | ICD-10-CM | POA: Diagnosis not present

## 2018-10-04 MED ORDER — METOPROLOL TARTRATE 25 MG PO TABS: ORAL_TABLET | ORAL | Status: DC

## 2018-10-04 NOTE — Progress Notes (Signed)
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  (records req) in the 1990s self referred to pulmonary clinic 05/30/2018 for new onset palpitations    History of Present Illness  05/30/2018  Pulmonary/ 1st office eval/Sabirin Baray  Chief Complaint  Patient presents with  . Pulmonary Consult    Self referral. She states had a couple of episodes of heart palpitations last wk. She states her chest "felt funny".  She has noticed some SOB over the past month when she sings. She states she constant PND and has spit up some some green sputum recently.   Dyspnea:  Last worked out one week before [employer] got busy.  Two miles on gxt    4.0 mph and 1 degrees and then elipitical for last mile  Cough: none Sleep: flat ok never noct palp. SABA use: none New onset one week prior to OV  = 3 episodes of palp they all occurred at rest, lasted one min resolved spont, none with presyncope, cp, or sob. Does drink mountain dew, no excess coffee, tea, decongestants or street drugs/ vapes Since stopped mountain dew palpitations resolved rec Stop mountain dew and all decongestants/ caffeine Regular sub maximal exercise -  Call immediately light headed, chest pain with palpitations or short of breath Check labs:  wnl    07/04/2018  f/u ov/Zali Kamaka re: bronchiectasis / palpitations  Chief Complaint  Patient presents with  . Follow-up    Breathing is unchanged since her last visit. She states she had one episode of increased BP and heart rate while resting.   Dyspnea:  Not limited by breathing from desired activities  But limited by back Cough: no Sleeping: poorly staying some with dad with lung ca SABA use: none One episode x 5 min s sob, fluttering in chest, no syncope/ swearing nausea  Has occ hb/ not bad enough to treat rec Lopressor 25 mg twice daily    Please see patient coordinator before you leave  today  to schedule cardiology evaluation    Virtual Visit via Telephone Note 10/04/2018 re bronchiectasis/palpitations/ never saw cards  I connected with Carmen Bowen on 10/04/18 at  8:45 AM EDT by telephone and verified that I am speaking with the correct person using two identifiers.   I discussed the limitations, risks, security and privacy concerns of performing an evaluation and management service by telephone and the availability of in person appointments. I also discussed with the patient that there may be a patient responsible charge related to this service. The patient expressed understanding and agreed to proceed.   History of Present Illness: BP running low on lopressor 25 mg bid   ie around low 100's s presyncope/ orthostatic symptoms  Dyspnea:  Not limited by breathing from desired activities   Cough: no Sleeping: ok SABA use: none 02: none No more palpitations   No obvious day to day or daytime variability or assoc excess/ purulent sputum or mucus plugs or hemoptysis or cp or chest tightness, subjective wheeze or overt sinus or hb symptoms.    Also denies any obvious fluctuation of symptoms with weather or environmental changes or other aggravating or alleviating factors except as outlined above.   Meds reviewed/ med reconciliation completed         Observations/Objective: Sounds a bit anxious on the phone    Assessment and Plan: See  problem list for active a/p's   Follow Up Instructions: See avs for instructions unique to this ov which includes revised/ updated med list     I discussed the assessment and treatment plan with the patient. The patient was provided an opportunity to ask questions and all were answered. The patient agreed with the plan and demonstrated an understanding of the instructions.   The patient was advised to call back or seek an in-person evaluation if the symptoms worsen or if the condition fails to improve as anticipated.  I  provided 12  minutes of non-face-to-face time during this encounter.   Christinia Gully, MD

## 2018-10-04 NOTE — Assessment & Plan Note (Signed)
New onset 05/2018 ? Better p stopped mountain dew Echo done 07/02/18 wnl  - added lopressor 25 mg bid 07/04/2018  - referred to cards 07/04/2018 > note done    - 10/04/2018 improved with low bp so  reduced lopressor to 12.5 mg bid  - cards f/u is optional and will discuss at next ov

## 2018-10-04 NOTE — Assessment & Plan Note (Signed)
Eval in 1990s in pulmonary clinic ? 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 Bilateral perihilar bronchiectasis, architectural distortion and scarring may be post infectious in etiology. Sarcoid is not excluded but is considered less likely in the absence of other corroborating findings. 2. Enlarged pulmonary arteries, indicative of pulmonary arterial Hypertension > Echo done 07/02/18 wnl  3. Moderate hiatal hernia.  No purulent sputum or limiting sob  - ideally needs full pfts for new baseline once COVID - 19 restrictions have been lifted > try f/u 3 m for this purpose - call sooner if needed   Each maintenance medication was reviewed in detail including most importantly the difference between maintenance and as needed and under what circumstances the prns are to be used.  Please see AVS for specific  Instructions which are unique to this visit and I personally typed out  which were reviewed in detail over the phone with the patient and a copy provided electronically  via My Chart

## 2018-10-04 NOTE — Patient Instructions (Addendum)
Lopressor  Reduce to 12.5 mg twice daily until next visit and call if palpitations worsen on this dose   Please schedule a follow up visit in 3 months but call sooner if needed with PFTs on return and consider tapering lopressor down further then depending on how you are doing

## 2018-10-08 ENCOUNTER — Other Ambulatory Visit: Payer: Self-pay | Admitting: Internal Medicine

## 2018-10-08 DIAGNOSIS — R002 Palpitations: Secondary | ICD-10-CM

## 2018-12-26 ENCOUNTER — Telehealth: Payer: Self-pay | Admitting: Internal Medicine

## 2018-12-26 NOTE — Telephone Encounter (Signed)
Dr. Melvyn Novas you wanted [place] to request the paper chart and x-rays on this patient back on 06/01/2018. Per Sherlon Handing all old x-rays have been destroyed. [facility] has been unable to locate the chart at their storage building either.

## 2019-01-04 ENCOUNTER — Ambulatory Visit: Payer: 59 | Admitting: Internal Medicine

## 2019-01-07 ENCOUNTER — Other Ambulatory Visit: Payer: Self-pay

## 2019-01-07 ENCOUNTER — Encounter: Payer: Self-pay | Admitting: Internal Medicine

## 2019-01-07 ENCOUNTER — Ambulatory Visit: Payer: 59 | Admitting: Internal Medicine

## 2019-01-07 VITALS — BP 124/72 | HR 96 | Temp 97.1°F | Ht 70.0 in | Wt 178.0 lb

## 2019-01-07 DIAGNOSIS — J310 Chronic rhinitis: Secondary | ICD-10-CM | POA: Diagnosis not present

## 2019-01-07 DIAGNOSIS — R002 Palpitations: Secondary | ICD-10-CM

## 2019-01-07 DIAGNOSIS — R918 Other nonspecific abnormal finding of lung field: Secondary | ICD-10-CM | POA: Diagnosis not present

## 2019-01-07 DIAGNOSIS — Z23 Encounter for immunization: Secondary | ICD-10-CM

## 2019-01-07 NOTE — Progress Notes (Signed)
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  (records req) in the 1990s self referred to pulmonary clinic 05/30/2018 for new onset palpitations    History of Present Illness  05/30/2018  Pulmonary/ 1st office eval/Ayesha Markwell  Chief Complaint  Patient presents with  . Pulmonary Consult    Self referral. She states had a couple of episodes of heart palpitations last wk. She states her chest "felt funny".  She has noticed some SOB over the past month when she sings. She states she constant PND and has spit up some some green sputum recently.   Dyspnea:  Last worked out one week before [employer] got busy.  Two miles on gxt    4.0 mph and 1 degrees and then elipitical for last mile  Cough: none Sleep: flat ok never noct palp. SABA use: none New onset one week prior to OV  = 3 episodes of palp they all occurred at rest, lasted one min resolved spont, none with presyncope, cp, or sob. Does drink mountain dew, no excess coffee, tea, decongestants or street drugs/ vapes Since stopped mountain dew palpitations resolved rec Stop mountain dew and all decongestants/ caffeine Regular sub maximal exercise -  Call immediately light headed, chest pain with palpitations or short of breath Check labs:  wnl    07/04/2018  f/u ov/Christinea Brizuela re: bronchiectasis / palpitations  Chief Complaint  Patient presents with  . Follow-up    Breathing is unchanged since her last visit. She states she had one episode of increased BP and heart rate while resting.   Dyspnea:  Not limited by breathing from desired activities  But limited by back Cough: no Sleeping: poorly staying some with dad with lung ca SABA use: none One episode x 5 min s sob, fluttering in chest, no syncope/ swearing nausea  Has occ hb/ not bad enough to treat rec Lopressor 25 mg twice daily    Please see patient coordinator before you  leave today  to schedule cardiology evaluation   10/04/18 televisit rec reduce 12.5 mg twice daily    01/07/2019  f/u ov/Audrey Eller re: bronchiectasis /hbp Chief Complaint  Patient presents with  . Follow-up    Breathing is unchanged. No new co's.   Dyspnea:  Walks up 5 miles on the job s doe / one flight of a big building  Cough: none Sleeping: some nasal congestion/ itchy throat benadryl works better than zyrtec  SABA use: no 02: no   No obvious day to day or daytime variability or assoc excess/ purulent sputum or mucus plugs or hemoptysis or cp or chest tightness, subjective wheeze or overt  hb symptoms.    Also denies any obvious fluctuation of symptoms with weather or environmental changes or other aggravating or alleviating factors except as outlined above   No unusual exposure hx or h/o childhood pna/ asthma or knowledge of premature birth.  Current Allergies, Complete Past Medical History, Past Surgical History, Family History, and Social History were reviewed in [employer] record.  ROS  The following are not active complaints unless bolded Hoarseness, sore throat, dysphagia, dental problems, itching, sneezing,  nasal congestion or discharge of excess mucus or purulent secretions, ear ache,   fever, chills, sweats, unintended wt loss or wt gain, classically pleuritic or exertional cp,  orthopnea pnd or arm/hand swelling  or leg swelling,  presyncope, palpitations, abdominal pain, anorexia, nausea, vomiting, diarrhea  or change in bowel habits or change in bladder habits, change in stools or change in urine, dysuria, hematuria,  rash, arthralgias, visual complaints, headache, numbness, weakness or ataxia or problems with walking or coordination,  change in mood or  memory.        Current Meds  Medication Sig  . cetirizine (ZYRTEC) 10 MG tablet Take 10 mg by mouth daily.  . Cholecalciferol (VITAMIN D3) 1.25 MG (50000 UT) CAPS Take 1 capsule by mouth once a week.   . diphenhydrAMINE (BENADRYL) 50 MG capsule Take 50 mg by mouth every 6 (six) hours as needed.  . metoprolol tartrate (LOPRESSOR) 25 MG tablet Take 1 tablet by mouth twice daily  . Multiple Vitamin (MULTIVITAMIN WITH MINERALS) TABS Take 1 tablet by mouth daily.  . naproxen sodium (ANAPROX) 220 MG tablet Take 220 mg by mouth daily as needed.                Objective:     amb wf nad  01/07/2019        178   07/04/18 182 lb 3.2 oz (82.6 kg)  05/30/18 184 lb (83.5 kg)  04/11/13 171 lb (77.6 kg)    Vital signs reviewed - Note on arrival 02 sats  97% on RA      HEENT : pt wearing mask not removed for exam due to covid - 19 concerns.    NECK :  without JVD/Nodes/TM/ nl carotid upstrokes bilaterally   LUNGS: no acc muscle use,  Nl contour chest which is clear to A and P bilaterally without cough on insp or exp maneuvers   CV:  RRR  no s3 or murmur or increase in P2, and no edema   ABD:  soft and nontender with nl inspiratory excursion in the supine position. No bruits or organomegaly appreciated, bowel sounds nl  MS:  Nl gait/ ext warm without deformities, calf tenderness, cyanosis or clubbing No obvious joint restrictions   SKIN: warm and dry without lesions    NEURO:  alert, approp, nl sensorium with  no motor or cerebellar deficits apparent.        Assessment

## 2019-01-07 NOTE — Assessment & Plan Note (Addendum)
Try 1st gen H1 blockers per guidelines    Consider allergy profile on return    I had an extended discussion with the patient reviewing all relevant studies completed to date and  lasting 15 to 20 minutes of a 25 minute visit    Each maintenance medication was reviewed in detail including most importantly the difference between maintenance and prns and under what circumstances the prns are to be triggered using an action plan format that is not reflected in the computer generated alphabetically organized AVS.     Please see AVS for specific instructions unique to this visit that I personally wrote and verbalized to the the pt in detail and then reviewed with pt  by my nurse highlighting any  changes in therapy recommended at today's visit to their plan of care.

## 2019-01-07 NOTE — Assessment & Plan Note (Signed)
Eval in 1990s in pulmonary clinic ? 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 Bilateral perihilar bronchiectasis, architectural distortion and scarring may be post infectious in etiology. Sarcoid is not excluded but is considered less likely in the absence of other corroborating findings. 2. Enlarged pulmonary arteries, indicative of pulmonary arterial Hypertension > Echo done 07/02/18 wnl  3. Moderate hiatal hernia.  No active symptoms attributable to above findings, no change in rx needed

## 2019-01-07 NOTE — Patient Instructions (Addendum)
Centrum A to Z is all you should need for adequate D   For drainage / throat tickle try take CHLORPHENIRAMINE  4 mg  (Chlortab 4mg   at [employer] should be easiest to find in the green box)  take one every 4 hours as needed - available over the counter- may cause drowsiness so start with just a bedtime dose or two and see how you tolerate it before trying in daytime    Prevnar 13 today and Tdap   Please schedule a follow up visit in 6 months but call sooner if needed

## 2019-01-07 NOTE — Assessment & Plan Note (Signed)
New onset 05/2018 ? Better p stopped mountain dew Echo done 07/02/18 wnl  - added lopressor 25 mg bid 07/04/2018  - referred to cards 07/04/2018 > not  done  - 10/04/2018 improved with low bp so  reduced lopressor to 12.5 mg bid > improved 01/07/2019 and declined cards eval   Adequate control on present rx, reviewed in detail with pt > no change in rx needed  = lopressor 12.5 mg bid

## 2019-03-11 ENCOUNTER — Other Ambulatory Visit: Payer: Self-pay | Admitting: Obstetrics and Gynecology

## 2019-03-11 DIAGNOSIS — N6452 Nipple discharge: Secondary | ICD-10-CM

## 2019-03-21 ENCOUNTER — Ambulatory Visit
Admission: RE | Admit: 2019-03-21 | Discharge: 2019-03-21 | Disposition: A | Discharge status: Home / Self Care | Payer: 59 | Source: Ambulatory Visit | Attending: Obstetrics and Gynecology | Admitting: Obstetrics and Gynecology

## 2019-03-21 ENCOUNTER — Other Ambulatory Visit: Payer: Self-pay | Admitting: Obstetrics and Gynecology

## 2019-03-21 ENCOUNTER — Other Ambulatory Visit: Payer: Self-pay

## 2019-03-21 DIAGNOSIS — N6452 Nipple discharge: Secondary | ICD-10-CM

## 2019-03-27 ENCOUNTER — Ambulatory Visit
Admission: RE | Admit: 2019-03-27 | Discharge: 2019-03-27 | Disposition: A | Discharge status: Home / Self Care | Payer: 59 | Source: Ambulatory Visit | Attending: Obstetrics and Gynecology | Admitting: Obstetrics and Gynecology

## 2019-03-27 ENCOUNTER — Other Ambulatory Visit: Payer: Self-pay

## 2019-03-27 DIAGNOSIS — N6452 Nipple discharge: Secondary | ICD-10-CM

## 2019-03-27 HISTORY — PX: BREAST BIOPSY: SHX20

## 2019-04-03 ENCOUNTER — Other Ambulatory Visit: Payer: Self-pay | Admitting: Surgery

## 2019-04-03 DIAGNOSIS — N6452 Nipple discharge: Secondary | ICD-10-CM

## 2019-04-24 ENCOUNTER — Other Ambulatory Visit: Payer: Self-pay | Admitting: Surgery

## 2019-04-24 ENCOUNTER — Ambulatory Visit
Admission: RE | Admit: 2019-04-24 | Discharge: 2019-04-24 | Disposition: A | Discharge status: Home / Self Care | Payer: 59 | Source: Ambulatory Visit | Attending: Surgery | Admitting: Surgery

## 2019-04-24 DIAGNOSIS — R9389 Abnormal findings on diagnostic imaging of other specified body structures: Secondary | ICD-10-CM

## 2019-04-24 DIAGNOSIS — N6452 Nipple discharge: Secondary | ICD-10-CM

## 2019-04-24 IMAGING — MR MR BREAST BILAT WO/W CM
8 of 13 series · 32 of 48 positions shown · IV contrast (8ml gadavist)
Comparison: Previous exam(s).

CLINICAL DATA: 58-year-old female with history left nipple
discharge and retraction. Recent ultrasound guided biopsy in the
retroareolar left breast demonstrated benign findings.

LABS:  Creatinine was obtained on site at [HOSPITAL] at [REDACTED] [HOSPITAL].
Results: Creatinine 0.7 mg/dL.
EXAM:
BILATERAL BREAST MRI WITH AND WITHOUT CONTRAST
TECHNIQUE: Multiplanar, multisequence MR images of both breasts were obtained
prior to and following the intravenous administration of 8 ml of
Gadavist.

[Series 2: t2_tirm_tra ipat · axial · 3.0mm · 0.70mm/px · 1 of 61 slices shown]
[im 1/61]
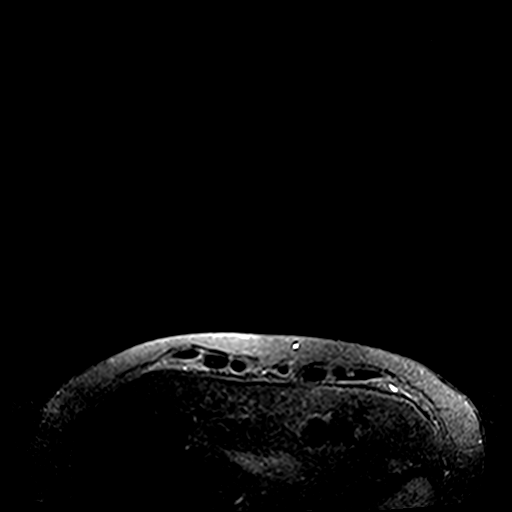

[Series 3: fl3d pre-cm no · axial · non-contrast · 1.2mm · 0.94mm/px · z∈[-88,+103]mm · 5 of 160 slices shown]
[im 1/160]
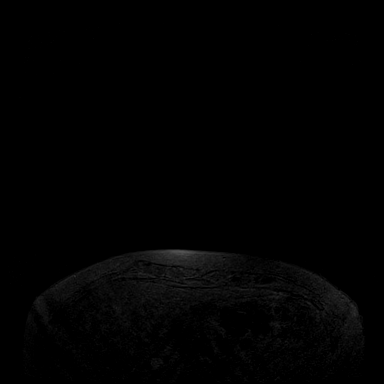
[im 40/160]
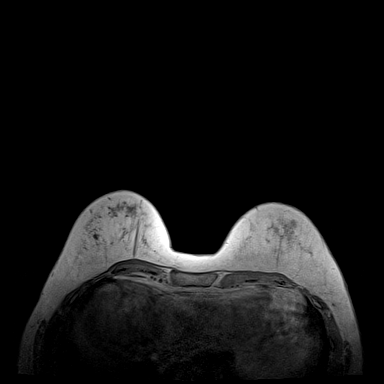
[im 80/160]
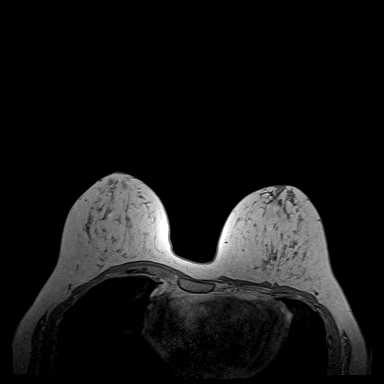
[im 120/160]
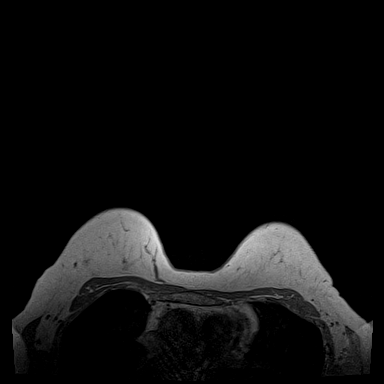
[im 160/160]
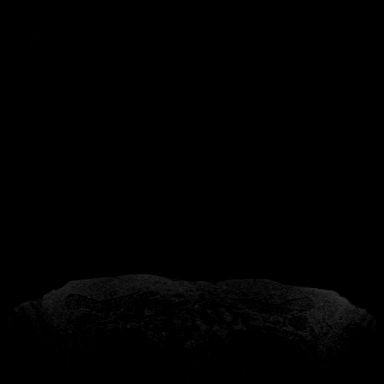

[Series 4: fl3d pre-cm · axial · non-contrast · 1.2mm · 0.94mm/px · z∈[-88,+103]mm · 5 of 160 slices shown]
[im 1/160]
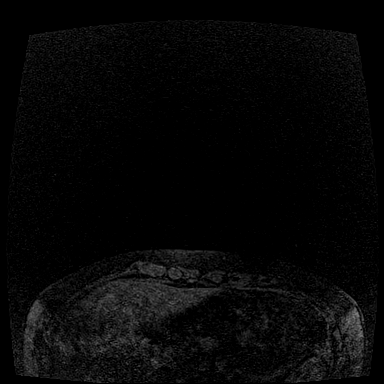
[im 40/160]
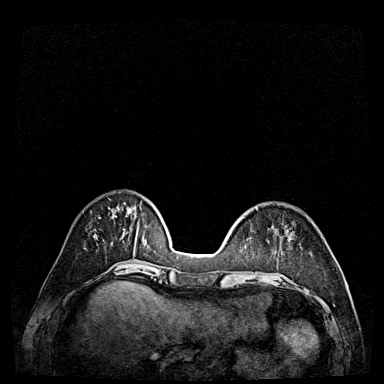
[im 80/160]
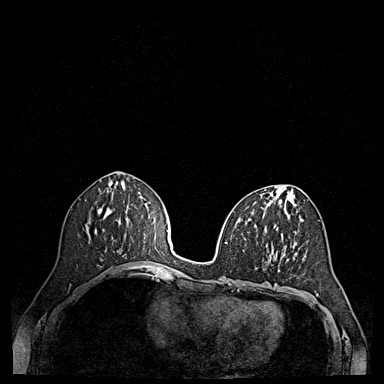
[im 120/160]
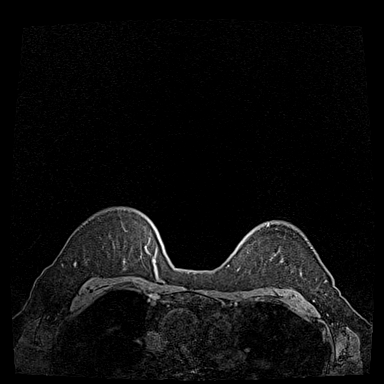
[im 160/160]
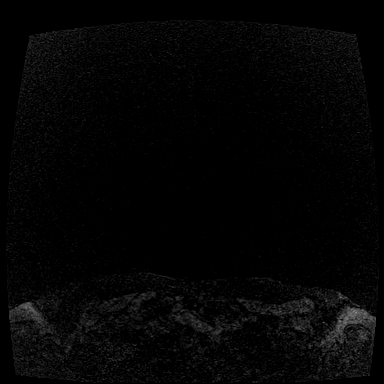

[Series 5: fl3d post-cm 20 · axial · 1.2mm · 0.94mm/px · z∈[-88,+103]mm · 5 of 160 slices shown (1 of 3)]
[im 1/160]
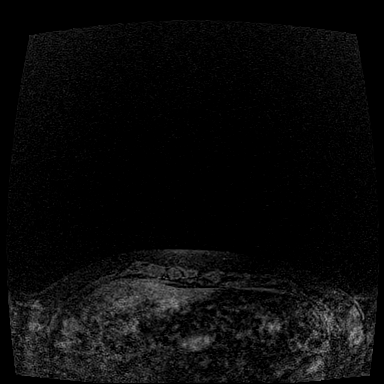
[im 40/160]
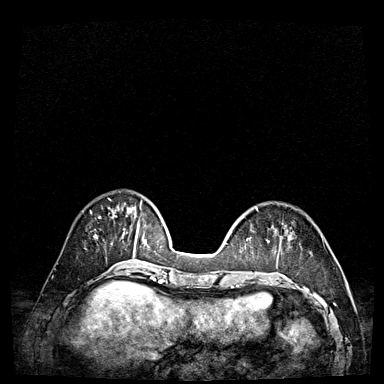
[im 80/160]
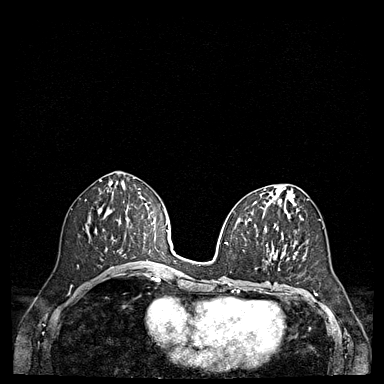
[im 120/160]
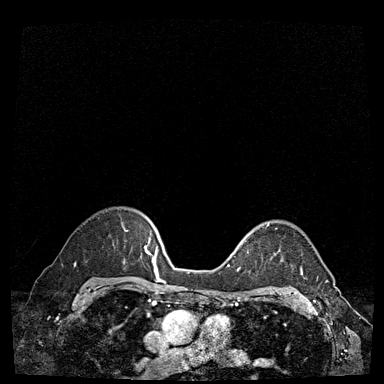
[im 160/160]
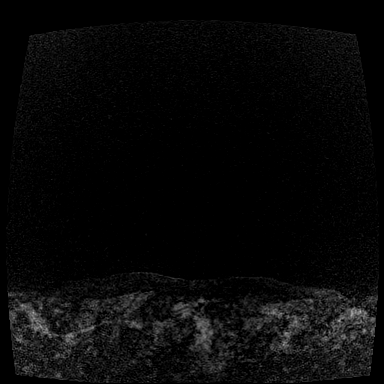

[Series 6: fl3d post-cm 20 · axial · 1.2mm · 0.94mm/px · z∈[-88,+103]mm · 5 of 160 slices shown (2 of 3)]
[im 1/160]
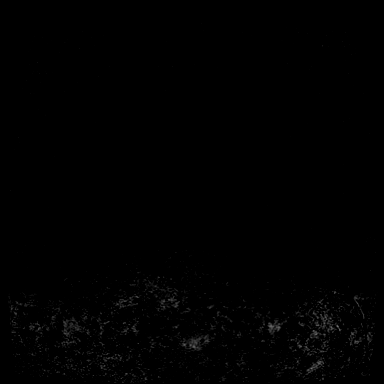
[im 40/160]
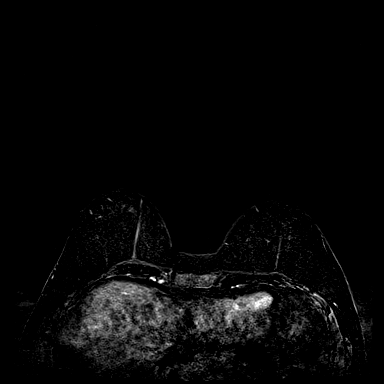
[im 80/160]
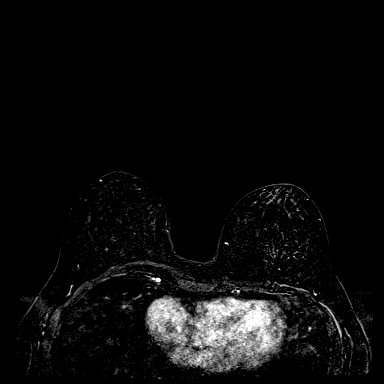
[im 120/160]
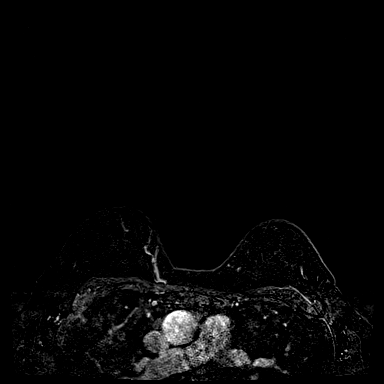
[im 160/160]
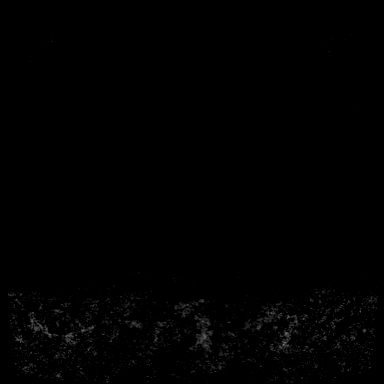

[Series 7: fl3d post-cm 20 · axial · 192.0mm · 0.94mm/px · 1 of 1 slices shown (3 of 3)]
[im 1/1]
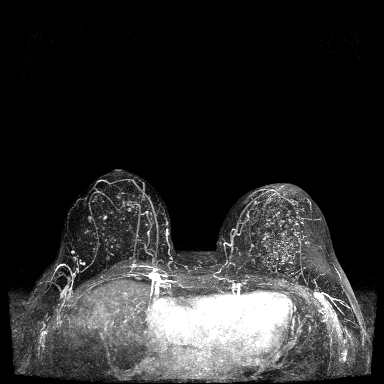

[Series 8: fl3d post-cm 3min · axial · 1.2mm · 0.94mm/px · z∈[-88,+103]mm · 5 of 160 slices shown]
[im 1/160]
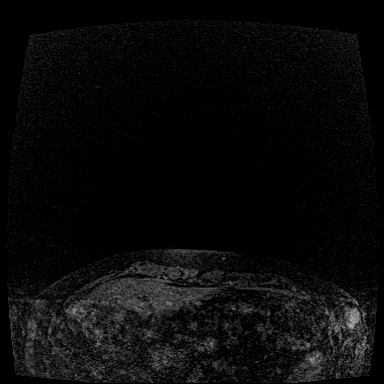
[im 40/160]
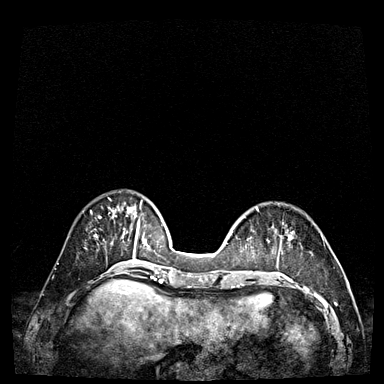
[im 80/160]
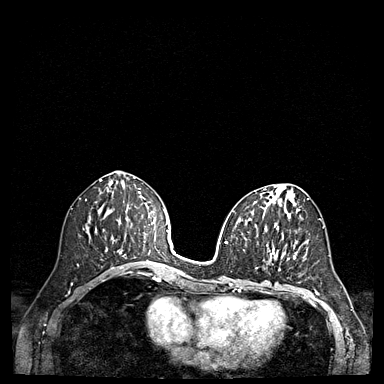
[im 120/160]
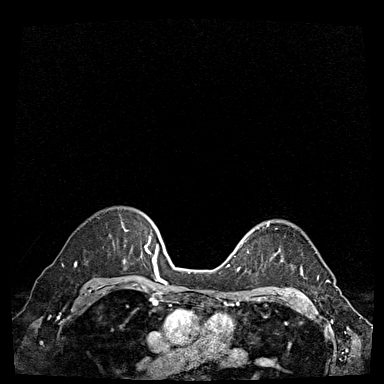
[im 160/160]
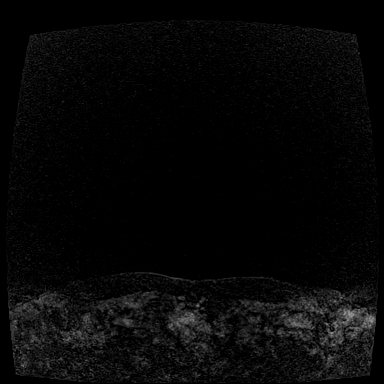

[Series 9: fl3d post-cm 3min_sub · axial · 1.2mm · 0.94mm/px · z∈[-88,+64]mm · 5 of 160 slices shown]
[im 1/160]
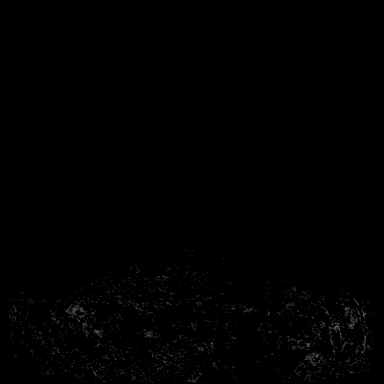
[im 32/160]
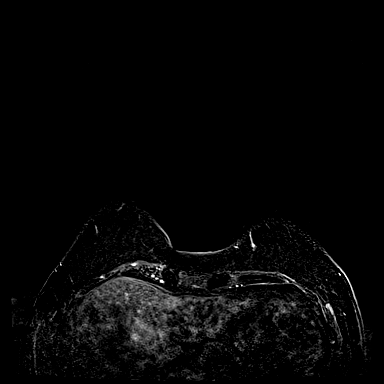
[im 64/160]
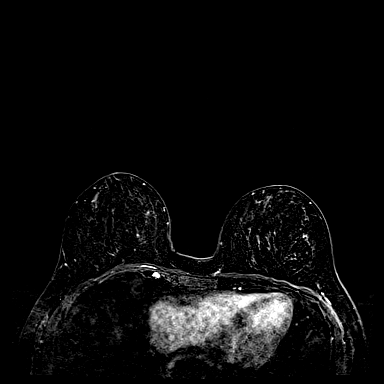
[im 96/160]
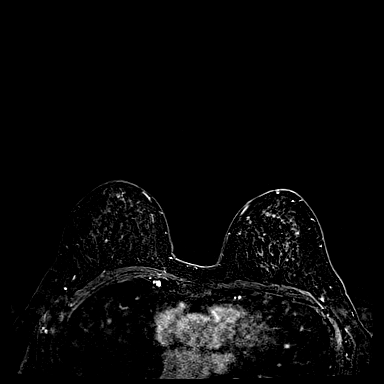
[im 128/160]
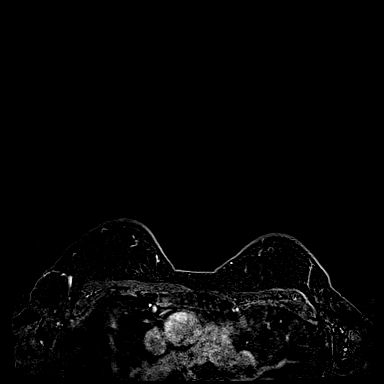

[32 of 48 positions shown; findings below may reference images not displayed]

Three-dimensional MR images were rendered by post-processing of the
original MR data on an independent workstation. The
three-dimensional MR images were interpreted, and findings are
reported in the following complete MRI report for this study. Three
dimensional images were evaluated at the independent [employer]
workstation
FINDINGS: Breast composition: c. Heterogeneous fibroglandular tissue.

Background parenchymal enhancement: Moderate.

Right breast: No suspicious mass or abnormal enhancement.

Left breast: There is a fluid filled duct with mild peripheral
enhancement in the lower inner quadrant of the left breast extending
from middle depth to the nipple (series 8, image 93/160). There is
an associated irregular, enhancing mass extending peripherally from
the duct (series 9, image 93/160). It measures 5 x 5 x 5 mm.
Susceptibility artifact from prior post biopsy clip is identified in
the upper slightly outer left breast at anterior depth. This is not
in association with the duct or mass.

A second irregular, enhancing mass is identified in the lower outer
quadrant at posterior depth (series 9, image 91/160). It measures 9
x 7 x 9 mm and demonstrates mild washout kinetics. There is no
associated T2 correlate.

No other suspicious mass or abnormal enhancement.

Lymph nodes: No abnormal appearing lymph nodes.

Ancillary findings:  None.
IMPRESSION: 1. Indeterminate 5 mm mass in association with a fluid-filled duct
in the lower inner left breast. Recommendation is for MRI guided
biopsy.
2. Indeterminate 9 mm mass in the lower outer left breast.
Recommendation is for MRI guided biopsy.
3. No MRI evidence of malignancy on the right.
4. No suspicious lymphadenopathy.

RECOMMENDATION:
Two area MRI guided biopsy of the left breast.

BI-RADS CATEGORY  4: Suspicious.

## 2019-04-24 MED ORDER — GADOBUTROL 1 MMOL/ML IV SOLN
8.0000 mL | Freq: Once | INTRAVENOUS | Status: AC | PRN
  Administered 2019-04-24: 8 mL via INTRAVENOUS

## 2019-05-07 ENCOUNTER — Ambulatory Visit
Admission: RE | Admit: 2019-05-07 | Discharge: 2019-05-07 | Disposition: A | Discharge status: Home / Self Care | Payer: 59 | Source: Ambulatory Visit | Attending: Surgery | Admitting: Surgery

## 2019-05-07 ENCOUNTER — Other Ambulatory Visit: Payer: Self-pay | Admitting: General Practice

## 2019-05-07 ENCOUNTER — Other Ambulatory Visit: Payer: Self-pay

## 2019-05-07 DIAGNOSIS — R9389 Abnormal findings on diagnostic imaging of other specified body structures: Secondary | ICD-10-CM

## 2019-05-07 HISTORY — PX: BREAST BIOPSY: SHX20

## 2019-05-07 MED ORDER — GADOBUTROL 1 MMOL/ML IV SOLN: 7.0000 mL | Freq: Once | INTRAVENOUS | Status: DC | PRN

## 2019-07-08 ENCOUNTER — Ambulatory Visit: Payer: 59 | Admitting: Internal Medicine

## 2019-08-19 ENCOUNTER — Encounter: Payer: Self-pay | Admitting: Internal Medicine

## 2019-08-19 ENCOUNTER — Other Ambulatory Visit: Payer: Self-pay

## 2019-08-19 ENCOUNTER — Ambulatory Visit: Payer: 59 | Admitting: Internal Medicine

## 2019-08-19 DIAGNOSIS — R002 Palpitations: Secondary | ICD-10-CM | POA: Diagnosis not present

## 2019-08-19 DIAGNOSIS — R918 Other nonspecific abnormal finding of lung field: Secondary | ICD-10-CM | POA: Diagnosis not present

## 2019-08-19 MED ORDER — METOPROLOL TARTRATE 25 MG PO TABS: ORAL_TABLET | ORAL | 11 refills | Status: DC

## 2019-08-19 NOTE — Patient Instructions (Addendum)
No change in medications for now  We will be referring to [facility] Primary care  I strongly recommend you reconsider the covid 19 vaccinations

## 2019-08-19 NOTE — Progress Notes (Signed)
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 Pulmonary Consult    Self referral. She states had a couple of episodes of heart palpitations last wk. She states her chest "felt funny".  She has noticed some SOB over the past month when she sings. She states she constant PND and has spit up some some green sputum recently.   Dyspnea:  Last worked out one week before [employer] got busy.  Two miles on gxt    4.0 mph and 1 degrees and then elipitical for last mile  Cough: none Sleep: flat ok never noct palp. SABA use: none New onset one week prior to OV  = 3 episodes of palp they all occurred at rest, lasted one min resolved spont, none with presyncope, cp, or sob. Does drink mountain dew, no excess coffee, tea, decongestants or street drugs/ vapes Since stopped mountain dew palpitations resolved rec Stop mountain dew and all decongestants/ caffeine Regular sub maximal exercise -  Call immediately light headed, chest pain with palpitations or short of breath Check labs:  wnl    07/04/2018  f/u ov/Carmen Bowen re: bronchiectasis / palpitations  Chief Complaint  Patient presents with  . Follow-up    Breathing is unchanged since her last visit. She states she had one episode of increased BP and heart rate while resting.   Dyspnea:  Not limited by breathing from desired activities  But limited by back Cough: no Sleeping: poorly staying some with dad with lung ca SABA use: none One episode x 5 min s sob, fluttering in chest, no syncope/ swearing nausea  Has occ hb/ not bad enough to treat rec Lopressor 25 mg twice daily    Please see patient coordinator before you  leave today  to schedule cardiology evaluation   10/04/18 televisit rec reduce 12.5 mg twice daily    01/07/2019  f/u ov/Carmen Bowen re: bronchiectasis /hbp Chief Complaint  Patient presents with  . Follow-up    Breathing is unchanged. No new co's.   Dyspnea:  Walks up 5 miles on the job s doe / one flight of a big building  Cough: none Sleeping: some nasal congestion/ itchy throat benadryl works better than zyrtec  SABA use: no 02: no rec Centrum A to Z is all you should need for adequate D  For drainage / throat tickle try take CHLORPHENIRAMINE  4 mg  (Chlortab 4mg   at [employer] should be easiest to find in the green box)  take one every 4 hours as needed -   Prevnar 13 today and Tdap     08/19/2019  f/u ov/Carmen Bowen re: bronchiectasis / hbp on lopressor 25 mg one one half twice  Chief Complaint  Patient presents with  . Follow-up    Pt c/o runny eyes and occ SOB- relates to her allergies.   Dyspnea:  Good ex tolerance  Cough: none / pnds  much worse if misses dose of zyrtec Sleeping: ok  SABA use: none 02: none    No obvious day to day or daytime variability or assoc excess/ purulent sputum or mucus plugs or hemoptysis or cp or chest  tightness, subjective wheeze or overt sinus or hb symptoms.   Sleeping  without nocturnal  or early am exacerbation  of respiratory  c/o's or need for noct saba. Also denies any obvious fluctuation of symptoms with weather or environmental changes or other aggravating or alleviating factors except as outlined above   No unusual exposure hx or h/o childhood pna/ asthma or knowledge of premature birth.  Current Allergies, Complete Past Medical History, Past Surgical History, Family History, and Social History were reviewed in [employer] record.  ROS  The following are not active complaints unless bolded Hoarseness, sore throat, dysphagia, dental problems, itching, sneezing,  nasal congestion or discharge of excess mucus or  purulent secretions, ear ache,   fever, chills, sweats, unintended wt loss or wt gain, classically pleuritic or exertional cp,  orthopnea pnd or arm/hand swelling  or leg swelling, presyncope, palpitations, abdominal pain, anorexia, nausea, vomiting, diarrhea  or change in bowel habits or change in bladder habits, change in stools or change in urine, dysuria, hematuria,  rash, arthralgias, visual complaints, headache, numbness, weakness or ataxia or problems with walking or coordination,  change in mood or  memory.                             Objective:       08/19/2019          01/07/2019        178   07/04/18 182 lb 3.2 oz (82.6 kg)  05/30/18 184 lb (83.5 kg)  04/11/13 171 lb (77.6 kg)    Vital signs reviewed - Note on arrival 02 sats  97% on RA and bp  136/76  And Pulse 61 p am lopressor     HEENT : pt wearing mask not removed for exam due to covid -19 concerns.    NECK :  without JVD/Nodes/TM/ nl carotid upstrokes bilaterally   LUNGS: no acc muscle use,  Nl contour chest which is clear to A and P bilaterally without cough on insp or exp maneuvers   CV:  RRR  no s3 or murmur or increase in P2, and no edema   ABD:  soft and nontender with nl inspiratory excursion in the supine position. No bruits or organomegaly appreciated, bowel sounds nl  MS:  Nl gait/ ext warm without deformities, calf tenderness, cyanosis or clubbing No obvious joint restrictions   SKIN: warm and dry without lesions    NEURO:  alert, approp, nl sensorium with  no motor or cerebellar deficits apparent.         Assessment

## 2019-08-20 ENCOUNTER — Encounter: Payer: Self-pay | Admitting: Internal Medicine

## 2019-08-20 NOTE — Assessment & Plan Note (Signed)
New onset 05/2018 ? Better p stopped mountain dew Echo done 07/02/18 wnl  - added lopressor 25 mg bid 07/04/2018  - referred to cards 07/04/2018 > not  done  - 10/04/2018 improved with low bp so  reduced lopressor to 12.5 mg bid > improved 01/07/2019 and declined cards eval   Better on lopresor 25 mg one half bid > f/u PCP >>> referred to [facility] Primary care.         Each maintenance medication was reviewed in detail including emphasizing most importantly the difference between maintenance and prns and under what circumstances the prns are to be triggered using an action plan format where appropriate.  Total time for H and P, chart review, counseling,  and generating customized AVS unique to this office visit / charting = 20 min

## 2019-08-20 NOTE — Assessment & Plan Note (Signed)
Eval in 1990s in pulmonary clinic ? 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 Bilateral perihilar bronchiectasis, architectural distortion and scarring may be post infectious in etiology. Sarcoid is not excluded but is considered less likely in the absence of other corroborating findings. 2. Enlarged pulmonary arteries, indicative of pulmonary arterial Hypertension > Echo done 07/02/18 wnl  3. Moderate hiatal hernia.  Pulmonary f/u can be prn in this setting though she does have underlying lung dz so:  Pt informed of the seriousness of COVID 19 infection as a direct risk to lung health  and safey and to close contacts and should continue to wear a facemask in public and minimize exposure to public locations but especially avoid any area or activity where non-close contacts are not observing distancing or wearing an appropriate face mask.  I strongly recommended vaccine asap.

## 2019-11-30 ENCOUNTER — Other Ambulatory Visit: Payer: Self-pay | Admitting: Internal Medicine

## 2019-11-30 DIAGNOSIS — R002 Palpitations: Secondary | ICD-10-CM

## 2020-03-03 ENCOUNTER — Other Ambulatory Visit: Payer: Self-pay | Admitting: Surgery

## 2020-03-03 DIAGNOSIS — Z1231 Encounter for screening mammogram for malignant neoplasm of breast: Secondary | ICD-10-CM

## 2020-04-08 ENCOUNTER — Ambulatory Visit
Admission: RE | Admit: 2020-04-08 | Discharge: 2020-04-08 | Disposition: A | Discharge status: Home / Self Care | Payer: 59 | Source: Ambulatory Visit | Attending: Surgery | Admitting: Surgery

## 2020-04-08 ENCOUNTER — Other Ambulatory Visit: Payer: Self-pay

## 2020-04-08 DIAGNOSIS — Z1231 Encounter for screening mammogram for malignant neoplasm of breast: Secondary | ICD-10-CM

## 2020-04-10 ENCOUNTER — Other Ambulatory Visit: Payer: Self-pay | Admitting: Surgery

## 2020-04-10 DIAGNOSIS — R928 Other abnormal and inconclusive findings on diagnostic imaging of breast: Secondary | ICD-10-CM

## 2020-04-30 ENCOUNTER — Ambulatory Visit
Admission: RE | Admit: 2020-04-30 | Discharge: 2020-04-30 | Disposition: A | Discharge status: Home / Self Care | Payer: 59 | Source: Ambulatory Visit | Attending: Surgery | Admitting: Surgery

## 2020-04-30 ENCOUNTER — Other Ambulatory Visit: Payer: Self-pay

## 2020-04-30 DIAGNOSIS — R928 Other abnormal and inconclusive findings on diagnostic imaging of breast: Secondary | ICD-10-CM

## 2020-10-26 ENCOUNTER — Ambulatory Visit: Payer: 59 | Admitting: Internal Medicine

## 2020-10-26 ENCOUNTER — Other Ambulatory Visit: Payer: Self-pay

## 2020-10-26 ENCOUNTER — Encounter: Payer: Self-pay | Admitting: Internal Medicine

## 2020-10-26 VITALS — BP 136/80 | HR 68 | Temp 98.0°F | Ht 70.0 in | Wt 185.6 lb

## 2020-10-26 DIAGNOSIS — Z0000 Encounter for general adult medical examination without abnormal findings: Secondary | ICD-10-CM | POA: Diagnosis not present

## 2020-10-26 DIAGNOSIS — I10 Essential (primary) hypertension: Secondary | ICD-10-CM

## 2020-10-26 LAB — CBC
HCT: 38.9 % (ref 36.0–46.0)
Hemoglobin: 13.3 g/dL (ref 12.0–15.0)
MCHC: 34.2 g/dL (ref 30.0–36.0)
MCV: 89.9 fl (ref 78.0–100.0)
Platelets: 207 10*3/uL (ref 150.0–400.0)
RBC: 4.32 Mil/uL (ref 3.87–5.11)
RDW: 13.3 % (ref 11.5–15.5)
WBC: 5.3 10*3/uL (ref 4.0–10.5)

## 2020-10-26 LAB — COMPREHENSIVE METABOLIC PANEL
ALT: 12 U/L (ref 0–35)
AST: 16 U/L (ref 0–37)
Albumin: 4.5 g/dL (ref 3.5–5.2)
Alkaline Phosphatase: 55 U/L (ref 39–117)
BUN: 15 mg/dL (ref 6–23)
CO2: 30 mEq/L (ref 19–32)
Calcium: 9.5 mg/dL (ref 8.4–10.5)
Chloride: 105 mEq/L (ref 96–112)
Creatinine, Ser: 0.59 mg/dL (ref 0.40–1.20)
GFR: 98.29 mL/min (ref >60.00)
Glucose, Bld: 95 mg/dL (ref 70–99)
Potassium: 4.9 mEq/L (ref 3.5–5.1)
Sodium: 141 mEq/L (ref 135–145)
Total Bilirubin: 0.5 mg/dL (ref 0.2–1.2)
Total Protein: 7.2 g/dL (ref 6.0–8.3)

## 2020-10-26 LAB — LIPID PANEL
Cholesterol: 164 mg/dL (ref 0–200)
HDL: 43.6 mg/dL (ref >39.00)
LDL Cholesterol: 101 mg/dL — ABNORMAL HIGH (ref 0–99)
NonHDL: 120.41
Total CHOL/HDL Ratio: 4
Triglycerides: 95 mg/dL (ref 0.0–149.0)
VLDL: 19 mg/dL (ref 0.0–40.0)

## 2020-10-26 NOTE — Assessment & Plan Note (Signed)
Flu shot counseled yearly. Covid-19 not done declines counseled. Pneumonia up to date. Shingrix counseled declines. Tetanus up to date. Colonoscopy up to date. Mammogram up to date, pap smear not indicated. Counseled about sun safety and mole surveillance. Counseled about the dangers of distracted driving. Given 10 year screening recommendations.

## 2020-10-26 NOTE — Patient Instructions (Signed)
We will check the blood work today. ° ° °

## 2020-10-26 NOTE — Assessment & Plan Note (Signed)
BP at goal on metoprolol. Checking CBC and CMP.

## 2020-10-26 NOTE — Progress Notes (Signed)
   Subjective:   Patient ID: Carmen Bowen, female    DOB: 1960/06/16, 60 y.o.   MRN: [id number]  HPI The patient is a 60 YO female coming in new for physical.   PMH, [place], social history reviewed and updated  Review of Systems  Constitutional: Negative.   HENT: Negative.    Eyes: Negative.   Respiratory:  Negative for cough, chest tightness and shortness of breath.   Cardiovascular:  Negative for chest pain, palpitations and leg swelling.  Gastrointestinal:  Negative for abdominal distention, abdominal pain, constipation, diarrhea, nausea and vomiting.  Musculoskeletal: Negative.   Skin: Negative.   Neurological: Negative.   Psychiatric/Behavioral: Negative.     Objective:  Physical Exam Constitutional:      Appearance: She is well-developed.  HENT:     Head: Normocephalic and atraumatic.  Cardiovascular:     Rate and Rhythm: Normal rate and regular rhythm.  Pulmonary:     Effort: Pulmonary effort is normal. No respiratory distress.     Breath sounds: Normal breath sounds. No wheezing or rales.  Abdominal:     General: Bowel sounds are normal. There is no distension.     Palpations: Abdomen is soft.     Tenderness: There is no abdominal tenderness. There is no rebound.  Musculoskeletal:     Cervical back: Normal range of motion.  Skin:    General: Skin is warm and dry.  Neurological:     Mental Status: She is alert and oriented to person, place, and time.     Coordination: Coordination normal.    Vitals:   10/26/20 0848  BP: 136/80  Pulse: 68  Temp: 98 F (36.7 C)  TempSrc: Oral  SpO2: 98%  Weight: 185 lb 9.6 oz (84.2 kg)  Height: 5\' 10"  (1.778 m)    This visit occurred during the SARS-CoV-2 public health emergency.  Safety protocols were in place, including screening questions prior to the visit, additional usage of staff PPE, and extensive cleaning of exam room while observing appropriate contact time as indicated for disinfecting solutions.    Assessment & Plan:

## 2020-11-13 ENCOUNTER — Other Ambulatory Visit: Payer: Self-pay | Admitting: Internal Medicine

## 2020-11-13 DIAGNOSIS — R002 Palpitations: Secondary | ICD-10-CM

## 2020-11-16 ENCOUNTER — Telehealth: Payer: Self-pay | Admitting: Internal Medicine

## 2020-11-16 DIAGNOSIS — R002 Palpitations: Secondary | ICD-10-CM

## 2020-11-16 MED ORDER — METOPROLOL TARTRATE 25 MG PO TABS: 25.0000 mg | ORAL_TABLET | Freq: Two times a day (BID) | ORAL | 3 refills | Status: DC

## 2020-11-16 NOTE — Telephone Encounter (Signed)
1.Medication Requested: metoprolol tartrate (LOPRESSOR) 25 MG tablet   2. Pharmacy (Name, Street, [place]): [facility] ([facility] [place] [address]   3. On Med List: yes   4. Last Visit with PCP: 10-26-20  5. Next visit date with PCP: n/a   Patient states that she is out of medication. Please advise    Agent: Please be advised that RX refills may take up to 3 business days. We ask that you follow-up with your pharmacy.

## 2020-11-16 NOTE — Telephone Encounter (Signed)
Ok to refill since it wasn't prescribed by you. Please advise

## 2021-04-15 ENCOUNTER — Other Ambulatory Visit: Payer: Self-pay | Admitting: Obstetrics and Gynecology

## 2021-04-15 DIAGNOSIS — Z1231 Encounter for screening mammogram for malignant neoplasm of breast: Secondary | ICD-10-CM

## 2021-05-18 ENCOUNTER — Ambulatory Visit
Admission: RE | Admit: 2021-05-18 | Discharge: 2021-05-18 | Disposition: A | Discharge status: Home / Self Care | Payer: 59 | Source: Ambulatory Visit | Attending: Obstetrics and Gynecology | Admitting: Obstetrics and Gynecology

## 2021-05-18 DIAGNOSIS — Z1231 Encounter for screening mammogram for malignant neoplasm of breast: Secondary | ICD-10-CM

## 2021-05-20 ENCOUNTER — Other Ambulatory Visit: Payer: Self-pay | Admitting: Obstetrics and Gynecology

## 2021-05-20 DIAGNOSIS — R928 Other abnormal and inconclusive findings on diagnostic imaging of breast: Secondary | ICD-10-CM

## 2021-06-23 ENCOUNTER — Ambulatory Visit: Payer: 59

## 2021-06-23 ENCOUNTER — Ambulatory Visit
Admission: RE | Admit: 2021-06-23 | Discharge: 2021-06-23 | Disposition: A | Discharge status: Home / Self Care | Payer: 59 | Source: Ambulatory Visit | Attending: Obstetrics and Gynecology | Admitting: Obstetrics and Gynecology

## 2021-06-23 DIAGNOSIS — R928 Other abnormal and inconclusive findings on diagnostic imaging of breast: Secondary | ICD-10-CM

## 2021-12-17 ENCOUNTER — Other Ambulatory Visit: Payer: Self-pay | Admitting: Internal Medicine

## 2021-12-17 DIAGNOSIS — R002 Palpitations: Secondary | ICD-10-CM

## 2021-12-21 ENCOUNTER — Other Ambulatory Visit: Payer: Self-pay | Admitting: Internal Medicine

## 2021-12-21 DIAGNOSIS — R002 Palpitations: Secondary | ICD-10-CM

## 2021-12-22 ENCOUNTER — Other Ambulatory Visit: Payer: Self-pay | Admitting: Internal Medicine

## 2021-12-22 DIAGNOSIS — R002 Palpitations: Secondary | ICD-10-CM

## 2021-12-29 ENCOUNTER — Ambulatory Visit: Payer: 59 | Admitting: Internal Medicine

## 2021-12-29 ENCOUNTER — Encounter: Payer: Self-pay | Admitting: Internal Medicine

## 2021-12-29 VITALS — BP 130/78 | HR 51 | Temp 98.5°F | Ht 70.0 in

## 2021-12-29 DIAGNOSIS — I10 Essential (primary) hypertension: Secondary | ICD-10-CM | POA: Diagnosis not present

## 2021-12-29 DIAGNOSIS — Z0000 Encounter for general adult medical examination without abnormal findings: Secondary | ICD-10-CM | POA: Diagnosis not present

## 2021-12-29 DIAGNOSIS — A6000 Herpesviral infection of urogenital system, unspecified: Secondary | ICD-10-CM | POA: Insufficient documentation

## 2021-12-29 DIAGNOSIS — R918 Other nonspecific abnormal finding of lung field: Secondary | ICD-10-CM

## 2021-12-29 DIAGNOSIS — R002 Palpitations: Secondary | ICD-10-CM | POA: Diagnosis not present

## 2021-12-29 MED ORDER — METOPROLOL TARTRATE 25 MG PO TABS: 12.5000 mg | ORAL_TABLET | Freq: Two times a day (BID) | ORAL | 3 refills | Status: DC

## 2021-12-29 NOTE — Progress Notes (Signed)
   Subjective:   Patient ID: Carmen Bowen, female    DOB: [birth year], 61 y.o.   MRN: [id number]  HPI The patient is here for physical.  PMH, [facility], social history reviewed and updated  Review of Systems  Constitutional: Negative.   HENT: Negative.    Eyes: Negative.   Respiratory:  Negative for cough, chest tightness and shortness of breath.   Cardiovascular:  Negative for chest pain, palpitations and leg swelling.  Gastrointestinal:  Negative for abdominal distention, abdominal pain, constipation, diarrhea, nausea and vomiting.  Musculoskeletal: Negative.   Skin: Negative.   Neurological: Negative.   Psychiatric/Behavioral: Negative.      Objective:  Physical Exam Constitutional:      Appearance: She is well-developed.  HENT:     Head: Normocephalic and atraumatic.  Cardiovascular:     Rate and Rhythm: Normal rate and regular rhythm.  Pulmonary:     Effort: Pulmonary effort is normal. No respiratory distress.     Breath sounds: Normal breath sounds. No wheezing or rales.  Abdominal:     General: Bowel sounds are normal. There is no distension.     Palpations: Abdomen is soft.     Tenderness: There is no abdominal tenderness. There is no rebound.  Musculoskeletal:     Cervical back: Normal range of motion.  Skin:    General: Skin is warm and dry.  Neurological:     Mental Status: She is alert and oriented to person, place, and time.     Coordination: Coordination normal.     Vitals:   12/29/21 0801  BP: 130/78  Pulse: (!) 51  Temp: 98.5 F (36.9 C)  TempSrc: Oral  SpO2: 97%  Height: '5\' 10"'$  (1.778 m)    Assessment & Plan:

## 2021-12-29 NOTE — Assessment & Plan Note (Signed)
Flu shot declines. Covid-19 declines. Shingrix declines. Tetanus declines. Colonoscopy up to date. Mammogram up to date, pap smear up to date. Counseled about sun safety and mole surveillance. Counseled about the dangers of distracted driving. Given 10 year screening recommendations.

## 2021-12-29 NOTE — Assessment & Plan Note (Signed)
BP controlled on metoprolol 12.5 mg BID. Refilled today.

## 2021-12-29 NOTE — Assessment & Plan Note (Signed)
No SOB and from prior infection. Continue to monitor.

## 2022-05-05 ENCOUNTER — Other Ambulatory Visit: Payer: Self-pay | Admitting: Obstetrics and Gynecology

## 2022-05-05 DIAGNOSIS — Z1231 Encounter for screening mammogram for malignant neoplasm of breast: Secondary | ICD-10-CM

## 2022-06-16 ENCOUNTER — Other Ambulatory Visit: Payer: Self-pay | Admitting: Internal Medicine

## 2022-06-16 DIAGNOSIS — R002 Palpitations: Secondary | ICD-10-CM

## 2022-06-27 ENCOUNTER — Ambulatory Visit
Admission: RE | Admit: 2022-06-27 | Discharge: 2022-06-27 | Disposition: A | Discharge status: Home / Self Care | Payer: 59 | Source: Ambulatory Visit | Attending: Obstetrics and Gynecology | Admitting: Obstetrics and Gynecology

## 2022-06-27 DIAGNOSIS — Z1231 Encounter for screening mammogram for malignant neoplasm of breast: Secondary | ICD-10-CM

## 2022-10-28 ENCOUNTER — Other Ambulatory Visit: Payer: Self-pay | Admitting: Internal Medicine

## 2022-10-28 DIAGNOSIS — R002 Palpitations: Secondary | ICD-10-CM

## 2022-12-14 ENCOUNTER — Telehealth: Payer: Self-pay | Admitting: Internal Medicine

## 2022-12-14 DIAGNOSIS — R002 Palpitations: Secondary | ICD-10-CM

## 2022-12-14 MED ORDER — METOPROLOL TARTRATE 25 MG PO TABS: 25.0000 mg | ORAL_TABLET | Freq: Two times a day (BID) | ORAL | 0 refills | Status: DC

## 2022-12-14 NOTE — Telephone Encounter (Signed)
Sent rx to [place].Marland KitchenJohny Chess

## 2022-12-14 NOTE — Telephone Encounter (Signed)
Prescription Request  12/14/2022  LOV: 12/29/2021 Pt has upcoming physical in September  What is the name of the medication or equipment? metoprolol tartrate (LOPRESSOR) 25 MG tablet   Have you contacted your pharmacy to request a refill? No   Which pharmacy would you like this sent to?  [facility] [address] ([address] [place] - [address] [place]) [place] [address] Phone: [phone] Fax: [phone]    Patient notified that their request is being sent to the clinical staff for review and that they should receive a response within 2 business days.   Please advise at Mobile [phone] (mobile)

## 2022-12-15 ENCOUNTER — Other Ambulatory Visit: Payer: Self-pay | Admitting: Internal Medicine

## 2022-12-15 DIAGNOSIS — R002 Palpitations: Secondary | ICD-10-CM

## 2022-12-22 ENCOUNTER — Encounter (INDEPENDENT_AMBULATORY_CARE_PROVIDER_SITE_OTHER): Payer: Self-pay

## 2022-12-28 ENCOUNTER — Ambulatory Visit: Payer: 59 | Admitting: Internal Medicine

## 2023-01-20 ENCOUNTER — Ambulatory Visit (INDEPENDENT_AMBULATORY_CARE_PROVIDER_SITE_OTHER): Payer: 59 | Admitting: Internal Medicine

## 2023-01-20 ENCOUNTER — Encounter: Payer: Self-pay | Admitting: Internal Medicine

## 2023-01-20 VITALS — BP 124/80 | HR 75 | Temp 98.2°F | Ht 70.0 in | Wt 178.0 lb

## 2023-01-20 DIAGNOSIS — Z1211 Encounter for screening for malignant neoplasm of colon: Secondary | ICD-10-CM | POA: Diagnosis not present

## 2023-01-20 DIAGNOSIS — I10 Essential (primary) hypertension: Secondary | ICD-10-CM

## 2023-01-20 DIAGNOSIS — Z0000 Encounter for general adult medical examination without abnormal findings: Secondary | ICD-10-CM

## 2023-01-20 DIAGNOSIS — R002 Palpitations: Secondary | ICD-10-CM

## 2023-01-20 LAB — COMPREHENSIVE METABOLIC PANEL
ALT: 13 U/L (ref 0–35)
AST: 20 U/L (ref 0–37)
Albumin: 4.2 g/dL (ref 3.5–5.2)
Alkaline Phosphatase: 53 U/L (ref 39–117)
BUN: 13 mg/dL (ref 6–23)
CO2: 31 meq/L (ref 19–32)
Calcium: 9.5 mg/dL (ref 8.4–10.5)
Chloride: 105 meq/L (ref 96–112)
Creatinine, Ser: 0.65 mg/dL (ref 0.40–1.20)
GFR: 94.53 mL/min (ref >60.00)
Glucose, Bld: 90 mg/dL (ref 70–99)
Potassium: 4.7 meq/L (ref 3.5–5.1)
Sodium: 142 meq/L (ref 135–145)
Total Bilirubin: 0.5 mg/dL (ref 0.2–1.2)
Total Protein: 7 g/dL (ref 6.0–8.3)

## 2023-01-20 LAB — CBC
HCT: 39.9 % (ref 36.0–46.0)
Hemoglobin: 13.3 g/dL (ref 12.0–15.0)
MCHC: 33.2 g/dL (ref 30.0–36.0)
MCV: 91.5 fl (ref 78.0–100.0)
Platelets: 222 10*3/uL (ref 150.0–400.0)
RBC: 4.36 Mil/uL (ref 3.87–5.11)
RDW: 12.6 % (ref 11.5–15.5)
WBC: 5 10*3/uL (ref 4.0–10.5)

## 2023-01-20 LAB — LIPID PANEL
Cholesterol: 169 mg/dL (ref 0–200)
HDL: 43.5 mg/dL (ref >39.00)
LDL Cholesterol: 97 mg/dL (ref 0–99)
NonHDL: 125.76
Total CHOL/HDL Ratio: 4
Triglycerides: 143 mg/dL (ref 0.0–149.0)
VLDL: 28.6 mg/dL (ref 0.0–40.0)

## 2023-01-20 MED ORDER — FLUTICASONE PROPIONATE 50 MCG/ACT NA SUSP: 2.0000 | Freq: Every day | NASAL | 3 refills | Status: DC

## 2023-01-20 MED ORDER — METOPROLOL TARTRATE 25 MG PO TABS: 12.5000 mg | ORAL_TABLET | Freq: Two times a day (BID) | ORAL | 3 refills | Status: DC

## 2023-01-20 NOTE — Progress Notes (Signed)
Subjective:   Patient ID: Carmen Bowen, female    DOB: 1961-04-13, 62 y.o.   MRN: [id number]  HPI The patient is here for physical.  PMH, [facility], social history reviewed and updated  Review of Systems  Constitutional: Negative.   HENT: Negative.    Eyes: Negative.   Respiratory:  Negative for cough, chest tightness and shortness of breath.   Cardiovascular:  Negative for chest pain, palpitations and leg swelling.  Gastrointestinal:  Negative for abdominal distention, abdominal pain, constipation, diarrhea, nausea and vomiting.  Musculoskeletal: Negative.   Skin: Negative.   Neurological: Negative.   Psychiatric/Behavioral: Negative.      Objective:  Physical Exam Constitutional:      Appearance: She is well-developed.  HENT:     Head: Normocephalic and atraumatic.  Cardiovascular:     Rate and Rhythm: Normal rate and regular rhythm.  Pulmonary:     Effort: Pulmonary effort is normal. No respiratory distress.     Breath sounds: Normal breath sounds. No wheezing or rales.  Abdominal:     General: Bowel sounds are normal. There is no distension.     Palpations: Abdomen is soft.     Tenderness: There is no abdominal tenderness. There is no rebound.  Musculoskeletal:     Cervical back: Normal range of motion.  Skin:    General: Skin is warm and dry.  Neurological:     Mental Status: She is alert and oriented to person, place, and time.     Coordination: Coordination normal.     Vitals:   01/20/23 0814  BP: 124/80  Pulse: 75  Temp: 98.2 F (36.8 C)  TempSrc: Oral  SpO2: 96%  Weight: 178 lb (80.7 kg)  Height: 5\' 10"  (1.778 m)    Assessment & Plan:

## 2023-01-20 NOTE — Assessment & Plan Note (Signed)
Flu shot counseled. Shingrix counseled. Tetanus up to date. Colonoscopy due referral placed. Mammogram up to date, pap smear up to date with gyn unsure if indicated. Counseled about sun safety and mole surveillance. Counseled about the dangers of distracted driving. Given 10 year screening recommendations.

## 2023-01-20 NOTE — Assessment & Plan Note (Signed)
Taking metoprolol 12.5 mg BID and refilled. BP at goal.

## 2023-02-21 ENCOUNTER — Encounter: Payer: Self-pay | Admitting: Internal Medicine

## 2023-04-24 ENCOUNTER — Ambulatory Visit: Payer: 59

## 2023-04-24 VITALS — Ht 70.0 in | Wt 178.0 lb

## 2023-04-24 DIAGNOSIS — Z1211 Encounter for screening for malignant neoplasm of colon: Secondary | ICD-10-CM

## 2023-04-24 NOTE — Progress Notes (Signed)
Pre visit completed via phone call; Patient verified name, DOB, and address; No egg or soy allergy known to patient;  No issues known to pt with past sedation with any surgeries or procedures; Patient denies ever being told they had issues or difficulty with intubation;  No FH of Malignant Hyperthermia; Pt is not on diet pills; Pt is not on home 02;  Pt is not on blood thinners;  Pt denies issues with constipation;  No A fib or A flutter; Have any cardiac testing pending--NO Insurance verified during PV appt--- UHC Pt can ambulate without assistance;  Pt denies use of chewing tobacco; Discussed diabetic/weight loss medication holds; Discussed NSAID holds; Checked BMI to be less than 50; Pt instructed to use Singlecare.com or GoodRx for a price reduction on prep;  Patient's chart reviewed by Cathlyn Parsons CNRA prior to previsit and patient appropriate for the [facility].  Pre visit completed and red dot placed by patient's name on their procedure day (on provider's schedule).    Instructions sent to MyChart per patient request;

## 2023-05-11 ENCOUNTER — Other Ambulatory Visit: Payer: Self-pay | Admitting: Obstetrics and Gynecology

## 2023-05-11 DIAGNOSIS — Z1231 Encounter for screening mammogram for malignant neoplasm of breast: Secondary | ICD-10-CM

## 2023-05-17 ENCOUNTER — Encounter: Payer: Self-pay | Admitting: Internal Medicine

## 2023-05-18 NOTE — Progress Notes (Signed)
 [facility] History and Physical   Primary Care Physician:  Rollene Almarie LABOR, MD   Reason for Procedure:  Colon cancer screening  Plan:    Colonoscopy     HPI: Carmen Bowen is a 63 y.o. female status post negative screening colonoscopy 2014 who presents for a repeat screening colonoscopy exam.  Maternal uncle and grandfather had colon cancer. Sister and mother had colon polyps   Past Medical History:  Diagnosis Date   Hypertension    on meds   Pulmonary fibrosis (HCC) dxx 20 years ago   Sees Dr. Ozell America (pulm)   Seasonal allergies     Past Surgical History:  Procedure Laterality Date   ANTERIOR AND POSTERIOR REPAIR  05/15/2012   Procedure: ANTERIOR (CYSTOCELE) AND POSTERIOR REPAIR (RECTOCELE);  Surgeon: Nathanel LELON Bunker, MD;  Location: [facility];  Service: Gynecology;  Laterality: N/A;  Posterior repair ONLY   BILATERAL SALPINGECTOMY  05/15/2012   Procedure: BILATERAL SALPINGECTOMY;  Surgeon: Nathanel LELON Bunker, MD;  Location: [facility];  Service: Gynecology;  Laterality: Bilateral;   BREAST BIOPSY Left 03/27/2019   BREAST BIOPSY Left 05/07/2019   PASH   LAPAROSCOPIC ASSISTED VAGINAL HYSTERECTOMY  05/15/2012   Procedure: LAPAROSCOPIC ASSISTED VAGINAL HYSTERECTOMY;  Surgeon: Nathanel LELON Bunker, MD;  Location: [facility];  Service: Gynecology;  Laterality: N/A;   LUNG BIOPSY  1990    Prior to Admission medications   Medication Sig Start Date End Date Taking? Authorizing Provider  cetirizine (ZYRTEC) 10 MG tablet Take 10 mg by mouth daily.    [provider]  fluticasone  (FLONASE ) 50 MCG/ACT nasal spray Place 2 sprays into both nostrils daily. 01/20/23   Rollene Almarie LABOR, MD  Magnesium 200 MG TABS Take 200 mg by mouth daily.    [provider]  metoprolol  tartrate (LOPRESSOR ) 25 MG tablet Take 0.5 tablets (12.5 mg total) by mouth 2 (two) times daily. Annual appt due in Sept must see provider for future refills 01/20/23   Rollene Almarie LABOR, MD  Multiple Vitamin (MULTIVITAMIN WITH MINERALS) TABS Take 1 tablet by mouth daily.    [provider]  naproxen sodium (ANAPROX) 220 MG tablet Take 220 mg by mouth daily as needed.     [provider]    Current Outpatient Medications  Medication Sig Dispense Refill   cetirizine (ZYRTEC) 10 MG tablet Take 10 mg by mouth daily.     fluticasone  (FLONASE ) 50 MCG/ACT nasal spray Place 2 sprays into both nostrils daily. 48 g 3   Magnesium 200 MG TABS Take 200 mg by mouth daily.     metoprolol  tartrate (LOPRESSOR ) 25 MG tablet Take 0.5 tablets (12.5 mg total) by mouth 2 (two) times daily. Annual appt due in Sept must see provider for future refills 90 tablet 3   Multiple Vitamin (MULTIVITAMIN WITH MINERALS) TABS Take 1 tablet by mouth daily.     naproxen sodium (ANAPROX) 220 MG tablet Take 220 mg by mouth daily as needed.      Current Facility-Administered Medications  Medication Dose Route Frequency Provider Last Rate Last Admin   0.9 %  sodium chloride  infusion  500 mL Intravenous Once Avram Lupita BRAVO, MD        Allergies as of 05/19/2023 - Review Complete 05/19/2023  Allergen Reaction Noted   Nitrofurantoin monohyd macro Palpitations and Other (See Comments) 07/09/2011    Family History  Problem Relation Age of Onset   Breast cancer Mother 46   Colon polyps Mother 54  Colon polyps Sister 35   Colon polyps Maternal Uncle 68   Colon cancer Maternal Uncle 50   Colon polyps Maternal Grandfather    Colon cancer Maternal Grandfather        unsure age of onset   Esophageal cancer Neg Hx    Rectal cancer Neg Hx    Stomach cancer Neg Hx     Social History   Socioeconomic History   Marital status: Married    Spouse name: Not on file   Number of children: Not on file   Years of education: Not on file   Highest education level: Not on file  Occupational History   Not on file  Tobacco Use   Smoking status: Former    Current packs/day: 0.00     Average packs/day: 0.3 packs/day for 8.0 years (2.0 ttl pk-yrs)    Types: Cigarettes    Start date: 05/10/1975    Quit date: 05/10/1983    Years since quitting: 40.0   Smokeless tobacco: Never  Vaping Use   Vaping status: Never Used  Substance and Sexual Activity   Alcohol use: No   Drug use: No   Sexual activity: Not on file  Other Topics Concern   Not on file  Social History Narrative   Not on file   Social Drivers of Health   Financial Resource Strain: Not on file  Food Insecurity: Not on file  Transportation Needs: Not on file  Physical Activity: Not on file  Stress: Not on file  Social Connections: Not on file  Intimate Partner Violence: Not on file    Review of Systems:  All other review of systems negative except as mentioned in the HPI.  Physical Exam: Vital signs BP 124/71   Pulse 69   Temp (!) 97.5 F (36.4 C) (Skin)   Ht 5' 10 (1.778 m)   Wt 178 lb (80.7 kg)   LMP 05/17/2011   SpO2 98%   BMI 25.54 kg/m   General:   Alert,  Well-developed, well-nourished, pleasant and cooperative in NAD Lungs:  Clear throughout to auscultation.   Heart:  Regular rate and rhythm; no murmurs, clicks, rubs,  or gallops. Abdomen:  Soft, nontender and nondistended. Normal bowel sounds.   Neuro/Psych:  Alert and cooperative. Normal mood and affect. A and O x 3   @Talvin Christianson  CHARLENA Commander, MD, [facility] [phone] (pager) 05/19/2023 8:48 AM@

## 2023-05-19 ENCOUNTER — Ambulatory Visit (AMBULATORY_SURGERY_CENTER): Payer: 59 | Admitting: Internal Medicine

## 2023-05-19 ENCOUNTER — Encounter: Payer: Self-pay | Admitting: Internal Medicine

## 2023-05-19 VITALS — BP 105/55 | HR 58 | Temp 97.5°F | Resp 10 | Ht 70.0 in | Wt 178.0 lb

## 2023-05-19 DIAGNOSIS — Z800 Family history of malignant neoplasm of digestive organs: Secondary | ICD-10-CM | POA: Diagnosis not present

## 2023-05-19 DIAGNOSIS — D120 Benign neoplasm of cecum: Secondary | ICD-10-CM | POA: Diagnosis not present

## 2023-05-19 DIAGNOSIS — Z83719 Family history of colon polyps, unspecified: Secondary | ICD-10-CM | POA: Diagnosis not present

## 2023-05-19 DIAGNOSIS — Z1211 Encounter for screening for malignant neoplasm of colon: Secondary | ICD-10-CM | POA: Diagnosis present

## 2023-05-19 DIAGNOSIS — D121 Benign neoplasm of appendix: Secondary | ICD-10-CM | POA: Diagnosis not present

## 2023-05-19 DIAGNOSIS — D122 Benign neoplasm of ascending colon: Secondary | ICD-10-CM

## 2023-05-19 DIAGNOSIS — K5730 Diverticulosis of large intestine without perforation or abscess without bleeding: Secondary | ICD-10-CM

## 2023-05-19 MED ORDER — SODIUM CHLORIDE 0.9 % IV SOLN: 500.0000 mL | Freq: Once | INTRAVENOUS | Status: DC

## 2023-05-19 NOTE — Op Note (Signed)
 [facility] Patient Name: Carmen Bowen Procedure Date: 05/19/2023 8:40 AM MRN: [id number] Endoscopist: Lupita FORBES Commander , MD, [id number] Age: 63 Referring MD:  Date of Birth: [birth year] Gender: Female Account #: [id number] Procedure:                Colonoscopy Indications:              Screening for colorectal malignant neoplasm, Last                            colonoscopy: 2014 Medicines:                Monitored Anesthesia Care Procedure:                Pre-Anesthesia Assessment:                           - Prior to the procedure, a History and Physical                            was performed, and patient medications and                            allergies were reviewed. The patient's tolerance of                            previous anesthesia was also reviewed. The risks                            and benefits of the procedure and the sedation                            options and risks were discussed with the patient.                            All questions were answered, and informed consent                            was obtained. Prior Anticoagulants: The patient has                            taken no anticoagulant or antiplatelet agents. ASA                            Grade Assessment: II - A patient with mild systemic                            disease. After reviewing the risks and benefits,                            the patient was deemed in satisfactory condition to                            undergo the procedure.  After obtaining informed consent, the colonoscope                            was passed under direct vision. Throughout the                            procedure, the patient's blood pressure, pulse, and                            oxygen saturations were monitored continuously. The                            Olympus Scope [id number] was introduced through the                            anus and advanced to the the cecum,  identified by                            appendiceal orifice and ileocecal valve. The                            colonoscopy was performed without difficulty. The                            patient tolerated the procedure well. The quality                            of the bowel preparation was good. The ileocecal                            valve, appendiceal orifice, and rectum were                            photographed. The bowel preparation used was                            Miralax via split dose instruction. Scope In: 8:57:47 AM Scope Out: 9:12:50 AM Scope Withdrawal Time: 0 hours 11 minutes 7 seconds  Total Procedure Duration: 0 hours 15 minutes 3 seconds  Findings:                 The perianal and digital rectal examinations were                            normal.                           A 3 mm polyp was found in the appendiceal orifice.                            The polyp was sessile. The polyp was removed with a                            cold snare. Resection and retrieval were complete.  Verification of patient identification for the                            specimen was done. Estimated blood loss was minimal.                           Multiple diverticula were found in the sigmoid                            colon and descending colon.                           The exam was otherwise without abnormality on                            direct and retroflexion views. Complications:            No immediate complications. Estimated Blood Loss:     Estimated blood loss was minimal. Impression:               - One 3 mm polyp at the appendiceal orifice,                            removed with a cold snare. Resected and retrieved.                           - Diverticulosis in the sigmoid colon and in the                            descending colon.                           - The examination was otherwise normal on direct                            and  retroflexion views. Recommendation:           - Patient has a contact number available for                            emergencies. The signs and symptoms of potential                            delayed complications were discussed with the                            patient. Return to normal activities tomorrow.                            Written discharge instructions were provided to the                            patient.                           - Resume previous diet.                           -  Continue present medications.                           - Await pathology results.                           - Repeat colonoscopy is recommended. The                            colonoscopy date will be determined after pathology                            results from today's exam become available for                            review. Note also has Fhx CRCA maternal uncle +                            grandfather Lupita FORBES Commander, MD 05/19/2023 9:20:13 AM This report has been signed electronically.

## 2023-05-19 NOTE — Progress Notes (Signed)
 Pt's states no medical or surgical changes since previsit or office visit.

## 2023-05-19 NOTE — Progress Notes (Signed)
 Sedate, gd SR, tolerated procedure well, VSS, report to RN

## 2023-05-19 NOTE — Patient Instructions (Addendum)
 I found and removed one tiny polyp.  You also have a condition called diverticulosis - common and not usually a problem. Please read the handout provided.  I will let you know pathology results and when to have another routine colonoscopy by mail and/or My Chart.  I appreciate the opportunity to care for you. Lupita CHARLENA Commander, MD, [facility]   Discharge instructions given. Handouts on polyps and Diverticulosis. Resume previous medications. YOU HAD AN ENDOSCOPIC PROCEDURE TODAY AT THE [facility]:   Refer to the procedure report that was given to you for any specific questions about what was found during the examination.  If the procedure report does not answer your questions, please call your gastroenterologist to clarify.  If you requested that your care partner not be given the details of your procedure findings, then the procedure report has been included in a sealed envelope for you to review at your convenience later.  YOU SHOULD EXPECT: Some feelings of bloating in the abdomen. Passage of more gas than usual.  Walking can help get rid of the air that was put into your GI tract during the procedure and reduce the bloating. If you had a lower endoscopy (such as a colonoscopy or flexible sigmoidoscopy) you may notice spotting of blood in your stool or on the toilet paper. If you underwent a bowel prep for your procedure, you may not have a normal bowel movement for a few days.  Please Note:  You might notice some irritation and congestion in your nose or some drainage.  This is from the oxygen used during your procedure.  There is no need for concern and it should clear up in a day or so.  SYMPTOMS TO REPORT IMMEDIATELY:  Following lower endoscopy (colonoscopy or flexible sigmoidoscopy):  Excessive amounts of blood in the stool  Significant tenderness or worsening of abdominal pains  Swelling of the abdomen that is new, acute  Fever of 100F or higher   For urgent or emergent  issues, a gastroenterologist can be reached at any hour by calling [phone]. Do not use MyChart messaging for urgent concerns.    DIET:  We do recommend a small meal at first, but then you may proceed to your regular diet.  Drink plenty of fluids but you should avoid alcoholic beverages for 24 hours.  ACTIVITY:  You should plan to take it easy for the rest of today and you should NOT DRIVE or use heavy machinery until tomorrow (because of the sedation medicines used during the test).    FOLLOW UP: Our staff will call the number listed on your records the next business day following your procedure.  We will call around 7:15- 8:00 am to check on you and address any questions or concerns that you may have regarding the information given to you following your procedure. If we do not reach you, we will leave a message.     If any biopsies were taken you will be contacted by phone or by letter within the next 1-3 weeks.  Please call us  at [phone] if you have not heard about the biopsies in 3 weeks.    SIGNATURES/CONFIDENTIALITY: You and/or your care partner have signed paperwork which will be entered into your electronic medical record.  These signatures attest to the fact that that the information above on your After Visit Summary has been reviewed and is understood.  Full responsibility of the confidentiality of this discharge information lies with you and/or your care-partner.

## 2023-05-22 ENCOUNTER — Telehealth: Payer: Self-pay

## 2023-05-22 NOTE — Telephone Encounter (Signed)
  Follow up Call-     05/19/2023    8:15 AM  Call back number  Post procedure Call Back phone  # [phone]  Permission to leave phone message Yes     Patient questions:  Do you have a fever, pain , or abdominal swelling? No. Pain Score  0 *  Have you tolerated food without any problems? Yes.    Have you been able to return to your normal activities? Yes.    Do you have any questions about your discharge instructions: Diet   No. Medications  No. Follow up visit  No.  Do you have questions or concerns about your Care? No.  Actions: * If pain score is 4 or above: No action needed, pain <4.

## 2023-05-23 ENCOUNTER — Encounter: Payer: Self-pay | Admitting: Internal Medicine

## 2023-05-23 DIAGNOSIS — Z860101 Personal history of adenomatous and serrated colon polyps: Secondary | ICD-10-CM

## 2023-05-23 HISTORY — DX: Personal history of adenomatous and serrated colon polyps: Z86.0101

## 2023-05-23 LAB — SURGICAL PATHOLOGY

## 2023-06-29 ENCOUNTER — Ambulatory Visit: Payer: 59

## 2023-07-07 ENCOUNTER — Ambulatory Visit
Admission: RE | Admit: 2023-07-07 | Discharge: 2023-07-07 | Disposition: A | Discharge status: Home / Self Care | Payer: 59 | Source: Ambulatory Visit | Attending: Obstetrics and Gynecology | Admitting: Obstetrics and Gynecology

## 2023-07-07 DIAGNOSIS — Z1231 Encounter for screening mammogram for malignant neoplasm of breast: Secondary | ICD-10-CM

## 2023-07-13 ENCOUNTER — Other Ambulatory Visit: Payer: Self-pay | Admitting: Obstetrics and Gynecology

## 2023-07-13 DIAGNOSIS — R928 Other abnormal and inconclusive findings on diagnostic imaging of breast: Secondary | ICD-10-CM

## 2023-07-22 ENCOUNTER — Ambulatory Visit
Admission: RE | Admit: 2023-07-22 | Discharge: 2023-07-22 | Disposition: A | Discharge status: Home / Self Care | Source: Ambulatory Visit | Attending: Obstetrics and Gynecology | Admitting: Obstetrics and Gynecology

## 2023-07-22 ENCOUNTER — Ambulatory Visit

## 2023-07-22 DIAGNOSIS — R928 Other abnormal and inconclusive findings on diagnostic imaging of breast: Secondary | ICD-10-CM

## 2024-01-21 ENCOUNTER — Other Ambulatory Visit: Payer: Self-pay | Admitting: Internal Medicine

## 2024-01-21 DIAGNOSIS — R002 Palpitations: Secondary | ICD-10-CM

## 2024-01-29 ENCOUNTER — Other Ambulatory Visit: Payer: Self-pay | Admitting: Internal Medicine

## 2024-01-29 DIAGNOSIS — R002 Palpitations: Secondary | ICD-10-CM

## 2024-01-29 NOTE — Telephone Encounter (Signed)
 Copied from CRM [id number]. Topic: Clinical - Medication Refill >> Jan 29, 2024 10:19 AM Zy'onna H wrote: Medication: Metoprolol  Tartrate metoprolol  tartrate (LOPRESSOR ) 25 MG tablet **Patient is requesting a 1 Month extension on this Rx**  Has the patient contacted their pharmacy? Yes (Agent: If no, request that the patient contact the pharmacy for the refill. If patient does not wish to contact the pharmacy document the reason why and proceed with request.) (Agent: If yes, when and what did the pharmacy advise?)  This is the patient's preferred pharmacy:  [facility] [address] ([address]), [place] - [address] [place] ([place]) [place] [address] Phone: [phone] Fax: [phone]  Is this the correct pharmacy for this prescription? Yes If no, delete pharmacy and type the correct one.   Has the prescription been filled recently? Yes  Is the patient out of the medication? Yes  Has the patient been seen for an appointment in the last year OR does the patient have an upcoming appointment? Yes  Can we respond through MyChart? Yes  Agent: Please be advised that Rx refills may take up to 3 business days. We ask that you follow-up with your pharmacy.

## 2024-02-03 ENCOUNTER — Other Ambulatory Visit: Payer: Self-pay | Admitting: Internal Medicine

## 2024-02-03 DIAGNOSIS — R002 Palpitations: Secondary | ICD-10-CM

## 2024-02-06 ENCOUNTER — Telehealth: Payer: Self-pay | Admitting: Radiology

## 2024-02-06 DIAGNOSIS — R002 Palpitations: Secondary | ICD-10-CM

## 2024-02-06 NOTE — Telephone Encounter (Signed)
 Copied from CRM [id number]. Topic: Clinical - Medication Question >> Feb 06, 2024 10:45 AM Harlene ORN wrote: Reason for CRM: Patient called to follow up on refill of her Metoprolol  medication that she first ordered on 09/14. Patient has already been scheduled for 10/14 to see her PCP.  Please call the patient back when refill has been sent to the pharmacy.

## 2024-02-07 ENCOUNTER — Other Ambulatory Visit: Payer: Self-pay

## 2024-02-07 DIAGNOSIS — R002 Palpitations: Secondary | ICD-10-CM

## 2024-02-07 MED ORDER — METOPROLOL TARTRATE 25 MG PO TABS: 12.5000 mg | ORAL_TABLET | Freq: Two times a day (BID) | ORAL | 0 refills | Status: DC

## 2024-02-07 NOTE — Telephone Encounter (Unsigned)
 Copied from CRM [id number]. Topic: Clinical - Medication Question >> Feb 07, 2024 12:56 PM Drema MATSU wrote: Patient is completely out medication now. Patient is needing medication filled today. She has an appointment for 10/14. Please call patient as soon as possible. She stated that medication is still not at pharmacy

## 2024-02-07 NOTE — Telephone Encounter (Signed)
I have sent in refill for patient.

## 2024-02-20 ENCOUNTER — Ambulatory Visit: Admitting: Internal Medicine

## 2024-02-20 ENCOUNTER — Ambulatory Visit: Payer: Self-pay | Admitting: Internal Medicine

## 2024-02-20 ENCOUNTER — Encounter: Payer: Self-pay | Admitting: Internal Medicine

## 2024-02-20 VITALS — BP 140/80 | HR 59 | Temp 98.0°F | Ht 70.0 in | Wt 184.0 lb

## 2024-02-20 DIAGNOSIS — I10 Essential (primary) hypertension: Secondary | ICD-10-CM | POA: Diagnosis not present

## 2024-02-20 DIAGNOSIS — R002 Palpitations: Secondary | ICD-10-CM

## 2024-02-20 DIAGNOSIS — Z0000 Encounter for general adult medical examination without abnormal findings: Secondary | ICD-10-CM | POA: Diagnosis not present

## 2024-02-20 LAB — COMPREHENSIVE METABOLIC PANEL WITH GFR
ALT: 13 U/L (ref 0–35)
AST: 18 U/L (ref 0–37)
Albumin: 4.5 g/dL (ref 3.5–5.2)
Alkaline Phosphatase: 51 U/L (ref 39–117)
BUN: 11 mg/dL (ref 6–23)
CO2: 32 meq/L (ref 19–32)
Calcium: 9 mg/dL (ref 8.4–10.5)
Chloride: 103 meq/L (ref 96–112)
Creatinine, Ser: 0.63 mg/dL (ref 0.40–1.20)
GFR: 94.52 mL/min (ref >60.00)
Glucose, Bld: 94 mg/dL (ref 70–99)
Potassium: 4.4 meq/L (ref 3.5–5.1)
Sodium: 139 meq/L (ref 135–145)
Total Bilirubin: 0.4 mg/dL (ref 0.2–1.2)
Total Protein: 7.1 g/dL (ref 6.0–8.3)

## 2024-02-20 LAB — LIPID PANEL
Cholesterol: 164 mg/dL (ref 0–200)
HDL: 47.8 mg/dL (ref >39.00)
LDL Cholesterol: 92 mg/dL (ref 0–99)
NonHDL: 116.37
Total CHOL/HDL Ratio: 3
Triglycerides: 121 mg/dL (ref 0.0–149.0)
VLDL: 24.2 mg/dL (ref 0.0–40.0)

## 2024-02-20 LAB — CBC
HCT: 39.1 % (ref 36.0–46.0)
Hemoglobin: 13.1 g/dL (ref 12.0–15.0)
MCHC: 33.4 g/dL (ref 30.0–36.0)
MCV: 90.9 fl (ref 78.0–100.0)
Platelets: 201 K/uL (ref 150.0–400.0)
RBC: 4.3 Mil/uL (ref 3.87–5.11)
RDW: 13.1 % (ref 11.5–15.5)
WBC: 5.1 K/uL (ref 4.0–10.5)

## 2024-02-20 MED ORDER — METOPROLOL TARTRATE 25 MG PO TABS: 12.5000 mg | ORAL_TABLET | Freq: Two times a day (BID) | ORAL | 3 refills | Status: AC

## 2024-02-20 MED ORDER — FLUTICASONE PROPIONATE 50 MCG/ACT NA SUSP: 2.0000 | Freq: Every day | NASAL | 3 refills | Status: AC

## 2024-02-20 NOTE — Progress Notes (Signed)
" ° °  Subjective:   Patient ID: Carmen Bowen, female    DOB: 1961-03-21, 63 y.o.   MRN: [id number]  The patient is here for physical. Pertinent topics discussed: Discussed the use of AI scribe software for clinical note transcription with the patient, who gave verbal consent to proceed.  History of Present Illness Carmen Bowen is a 63 year old female who presents for a routine follow-up visit.  Chronic aches and pains are present but not more than usual. She uses [facility]on[facility] for relief and visits a chiropractor every six to eight weeks for adjustments due to degenerative disc disease. Prolonged standing at work contributes to her back issues.  She has a history of basal cell carcinoma and visits the dermatologist annually. No new skin issues were reported.  She continues to have swelling in her feet and ankles, which she describes as related to her veins, but it is not worsening.  She has a history of bronchiectasis diagnosed over 30 years ago. No new respiratory symptoms such as chronic cough or breathlessness.  She takes metoprolol  for hypertension, which has helped with palpitations.    PMH, [facility], social history reviewed and updated  Review of Systems  Constitutional: Negative.   HENT: Negative.    Eyes: Negative.   Respiratory:  Negative for cough, chest tightness and shortness of breath.   Cardiovascular:  Negative for chest pain, palpitations and leg swelling.  Gastrointestinal:  Negative for abdominal distention, abdominal pain, constipation, diarrhea, nausea and vomiting.  Musculoskeletal: Negative.   Skin: Negative.   Neurological: Negative.   Psychiatric/Behavioral: Negative.      Objective:  Physical Exam Constitutional:      Appearance: She is well-developed.  HENT:     Head: Normocephalic and atraumatic.  Cardiovascular:     Rate and Rhythm: Normal rate and regular rhythm.  Pulmonary:     Effort: Pulmonary effort is normal. No respiratory  distress.     Breath sounds: Normal breath sounds. No wheezing or rales.  Abdominal:     General: Bowel sounds are normal. There is no distension.     Palpations: Abdomen is soft.     Tenderness: There is no abdominal tenderness.  Musculoskeletal:     Cervical back: Normal range of motion.  Skin:    General: Skin is warm and dry.  Neurological:     Mental Status: She is alert and oriented to person, place, and time.     Coordination: Coordination normal.     Vitals:   02/20/24 0814  BP: (!) 140/80  Pulse: (!) 59  Temp: 98 F (36.7 C)  TempSrc: Oral  SpO2: 98%  Weight: 184 lb (83.5 kg)  Height: 5' 10 (1.778 m)    Assessment & Plan:   "

## 2024-02-21 NOTE — Assessment & Plan Note (Signed)
 Flu shot declines. Pneumonia declines. Shingrix declines. Tetanus declines. Colonoscopy up to date. Mammogram up to date, pap smear up to date. Counseled about sun safety and mole surveillance. Counseled about the dangers of distracted driving. Given 10 year screening recommendations.

## 2024-02-21 NOTE — Assessment & Plan Note (Signed)
 BP at goal on metoprolol  continue. Checking CMP

## 2024-06-14 ENCOUNTER — Other Ambulatory Visit: Payer: Self-pay | Admitting: Obstetrics and Gynecology

## 2024-06-14 DIAGNOSIS — Z1231 Encounter for screening mammogram for malignant neoplasm of breast: Secondary | ICD-10-CM

## 2024-07-09 ENCOUNTER — Ambulatory Visit
